# Patient Record
Sex: Male | Born: 1945 | Race: White | Hispanic: No | Marital: Married | Smoking: Former smoker
Health system: Southern US, Community
[De-identification: ages and names within clinical notes are randomized; demographics above are authoritative.]

## PROBLEM LIST (undated history)

## (undated) DIAGNOSIS — G629 Polyneuropathy, unspecified: Secondary | ICD-10-CM

## (undated) DIAGNOSIS — K219 Gastro-esophageal reflux disease without esophagitis: Secondary | ICD-10-CM

## (undated) DIAGNOSIS — G4730 Sleep apnea, unspecified: Secondary | ICD-10-CM

## (undated) DIAGNOSIS — I10 Essential (primary) hypertension: Secondary | ICD-10-CM

## (undated) DIAGNOSIS — E7800 Pure hypercholesterolemia, unspecified: Secondary | ICD-10-CM

## (undated) DIAGNOSIS — M1990 Unspecified osteoarthritis, unspecified site: Secondary | ICD-10-CM

## (undated) DIAGNOSIS — M549 Dorsalgia, unspecified: Secondary | ICD-10-CM

## (undated) HISTORY — DX: Sleep apnea, unspecified: G47.30

## (undated) HISTORY — PX: DENTAL SURGERY: SHX609

## (undated) HISTORY — PX: MIDDLE EAR SURGERY: SHX713

## (undated) HISTORY — PX: COLONOSCOPY: SHX174

---

## 2004-12-27 ENCOUNTER — Observation Stay (HOSPITAL_COMMUNITY): Admission: EM | Admit: 2004-12-27 | Discharge: 2004-12-28 | Payer: Self-pay | Admitting: Emergency Medicine

## 2004-12-28 ENCOUNTER — Encounter: Payer: Self-pay | Admitting: Cardiology

## 2004-12-28 ENCOUNTER — Ambulatory Visit: Payer: Self-pay | Admitting: Cardiology

## 2004-12-29 ENCOUNTER — Ambulatory Visit: Payer: Self-pay

## 2006-06-01 IMAGING — CR DG CHEST 2V
2 series · 2 of 2 positions shown · non-contrast
Comparison: none

CLINICAL DATA: Midchest pain.  Tingling.  Shortness of breath. 
 CHEST ? 2 VIEW:
 PA and lateral views of the chest dated 12/27/04 are reviewed without prior films for comparison.  The heart is normal in size.  The pulmonary vascularity is within normal limits and no active inflammatory change is noted.  There appears to be minimal scarring in the left cardiophrenic area.

[w chest pa]
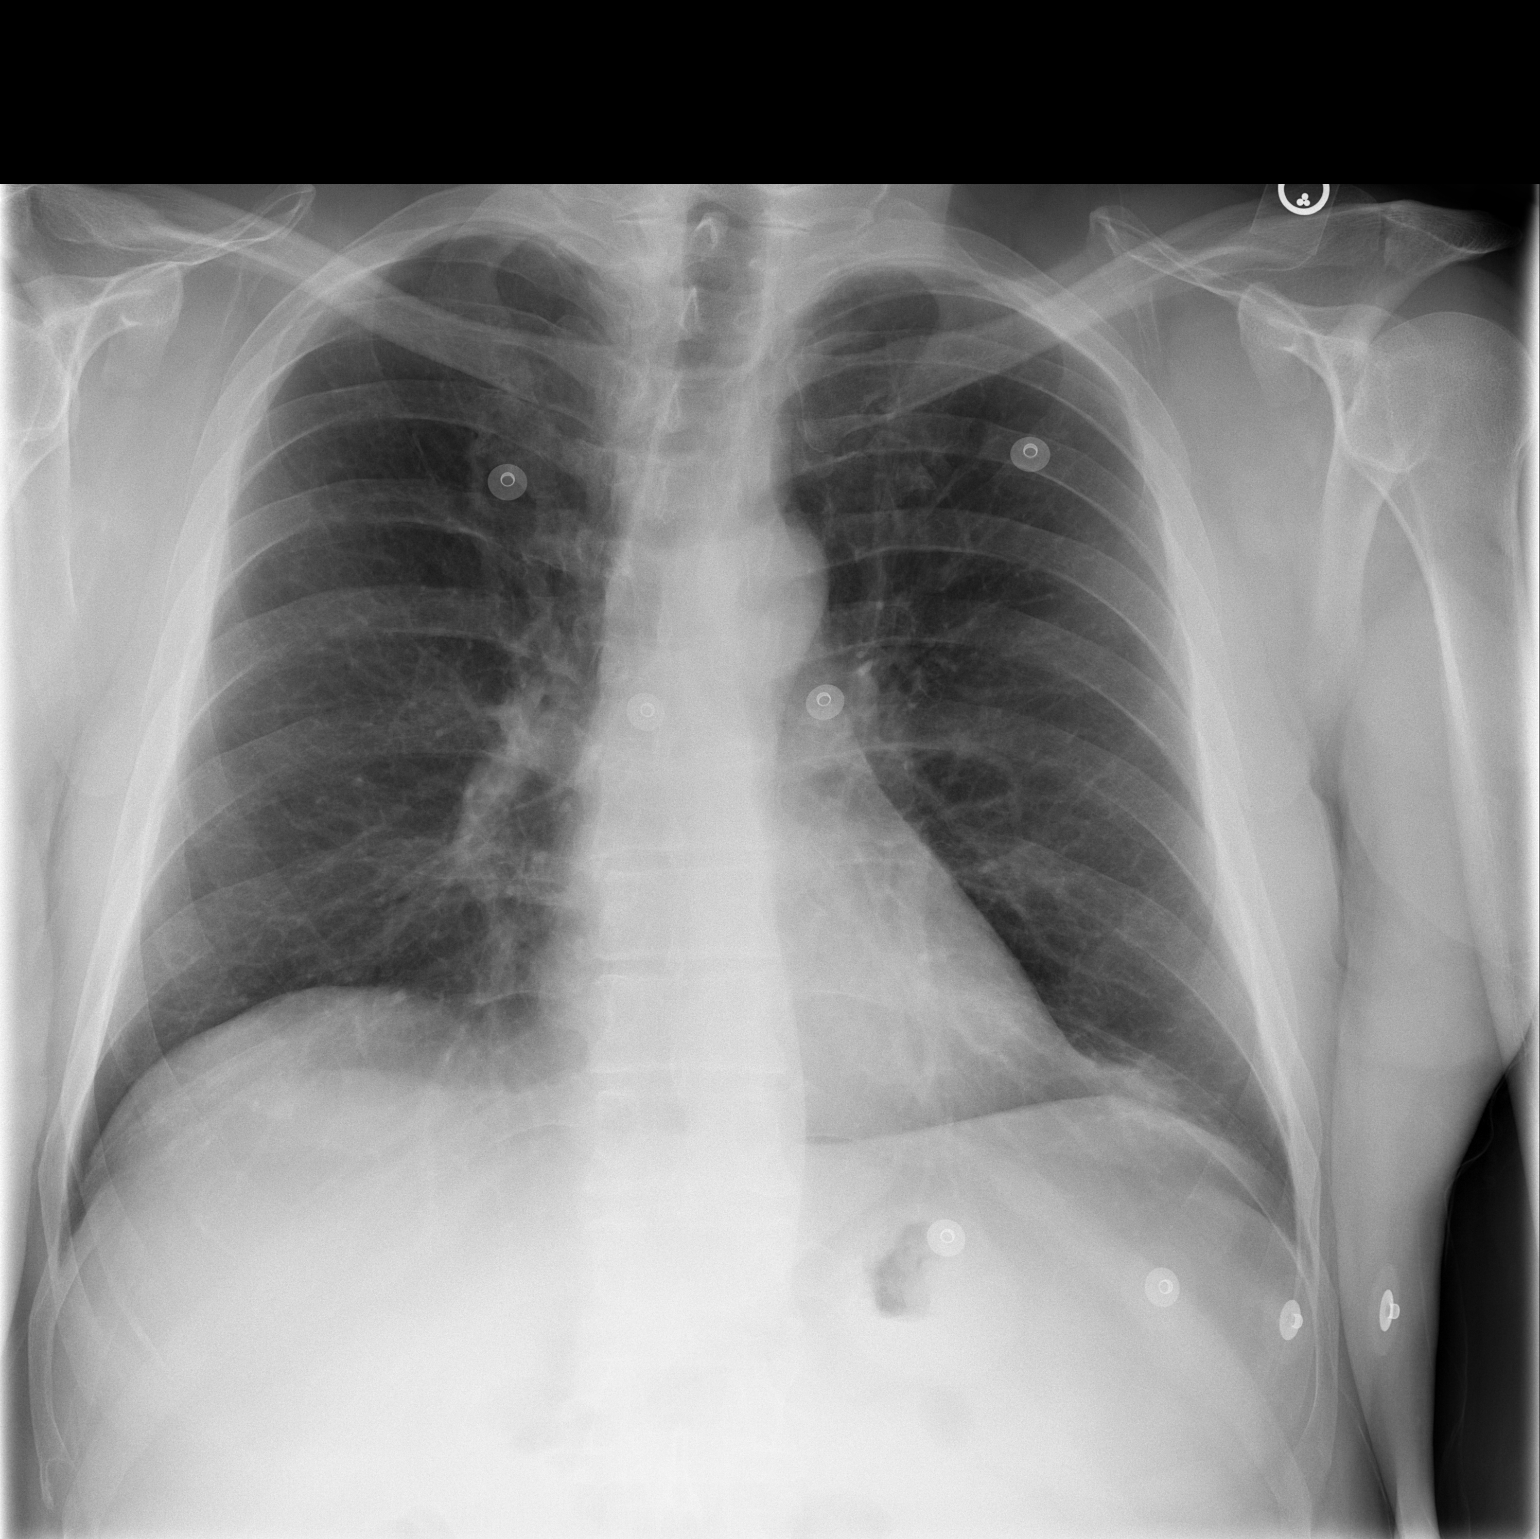

[w chest lat]
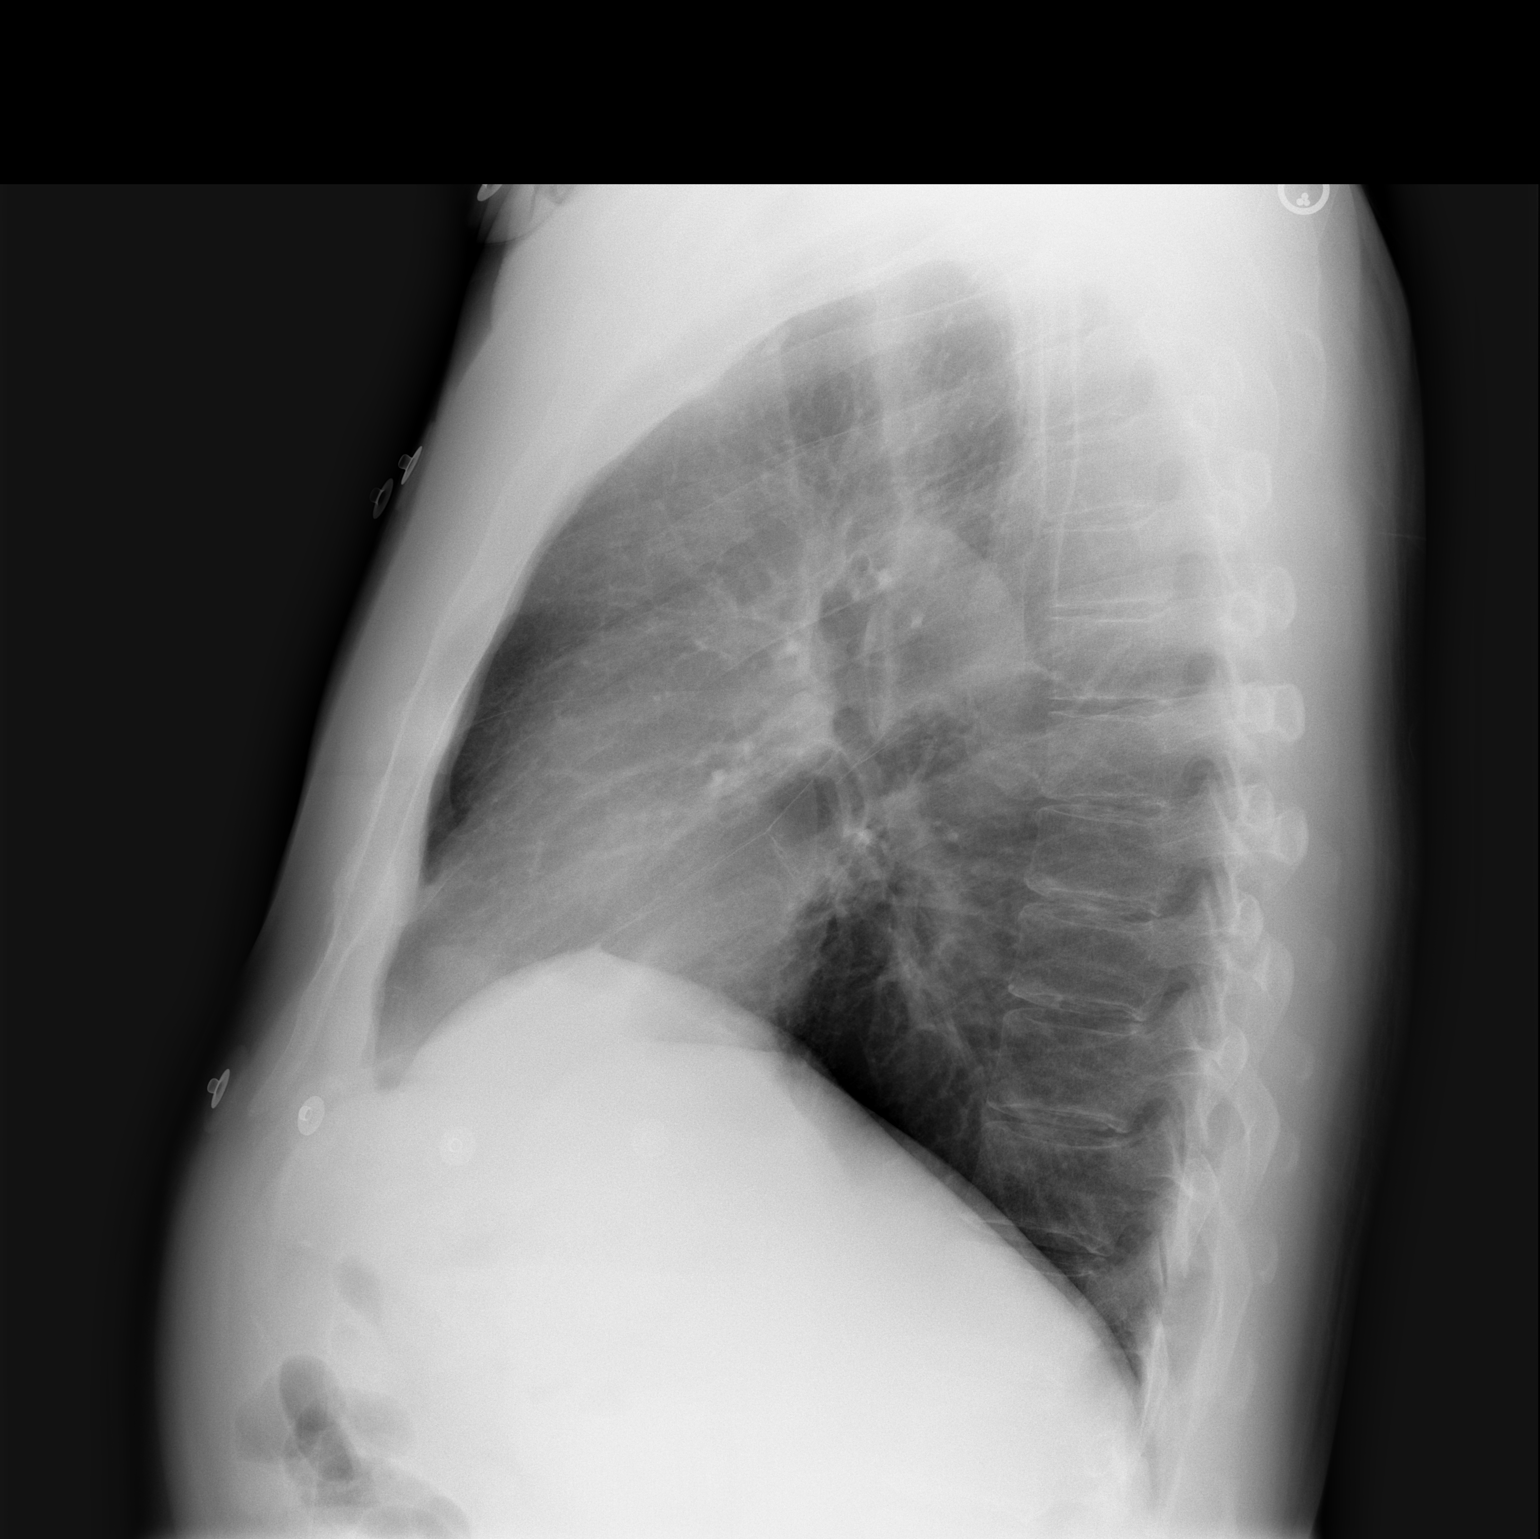

[2 of 2 positions shown; findings below may reference images not displayed]

IMPRESSION: Negative chest for active disease.

## 2006-07-25 HISTORY — PX: HAND SURGERY: SHX662

## 2006-09-05 ENCOUNTER — Ambulatory Visit (HOSPITAL_BASED_OUTPATIENT_CLINIC_OR_DEPARTMENT_OTHER): Admission: RE | Admit: 2006-09-05 | Discharge: 2006-09-05 | Payer: Self-pay | Admitting: Orthopedic Surgery

## 2007-05-22 ENCOUNTER — Ambulatory Visit (HOSPITAL_BASED_OUTPATIENT_CLINIC_OR_DEPARTMENT_OTHER): Admission: RE | Admit: 2007-05-22 | Discharge: 2007-05-22 | Payer: Self-pay | Admitting: Orthopedic Surgery

## 2007-07-26 HISTORY — PX: SHOULDER ARTHROSCOPY: SHX128

## 2010-12-07 NOTE — Op Note (Signed)
[name] [name], [name] [name]             ACCOUNT NO.:  [id number]   MEDICAL RECORD NO.:  [id number]          PATIENT TYPE:  AMB   LOCATION:  DSC                          FACILITY:  [facility]   PHYSICIAN:  Katy Fitch. Sypher, M.D. 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   OPERATION/PROCEDURE:  1. Examination of right shoulder under anesthesia documenting      significant adhesive capsulitis.  2. Arthroscopic debridement of labrum tenolysis of anterior capsular      ligaments and debridement of scar tissue from rotator interval.  3. Arthroscopic subacromial decompression with extensive tenolysis of      rotator cuff bursectomy and acromioplasty.  4. Arthroscopic distal clavicle resection.   SURGEON:  Katy Fitch. Sypher, M.D.   ASSISTANT:  Molly Maduro Dasnoit PA-C.   ANESTHESIA:  General by endotracheal technique supplemented by a right  interscalene block. Supervising anesthesiologist is Bedelia Person, M.D.   INDICATIONS:  [name] [name] is a 65 year old gentleman who I  met 1 year ago with a complex fracture dislocation of his left small  finger carpometacarpal joint.   [name] [name] had a 4+ swelling response following his fracture and ultimately  underwent attempted open reduction internal fixation of his  carpometacarpal joint.  His post injury course was marked by the  development of chronic swelling and a regional  pain syndrome which has  required about 1 year of sympathetic blocks,  extensive therapy and  rehabilitation.   During our period of working together, he reported bilateral shoulder  pain, right worse than left.  Plain x-ray of the shoulder demonstrated  AC arthropathy and signs of impingement.  An MRI of the shoulder  documented extensive tendinopathy of the rotator cuff and the  radiologist was concerned that there was a rotator cuff interval tear  and a superior subscapularis tear.   Due to failed respond to nonoperative measures, [name] [name] was advised  to present to the operating room at this time for examination of the  shoulder under anesthesia, anticipating arthroscopic debridement,  probable subacromial decompression, probable distal clavicle resection,  and possible rotator cuff repair.  Preoperatively questions were invited  and answered in detail, both in the office and in the preoperative  holding area.  Dr. Gypsy Balsam interviewed [name] [name] and after informed  consent, placed a right interscalene block for perioperative analgesia.  [name] [name] is brought to the operating room at this time.   DESCRIPTION OF PROCEDURE:  Seizure [name] [name] is brought to room #1  and placed in supine position on the operating table.   Under Dr. Burnett Corrente strict supervision, general endotracheal anesthesia  was induced.  Mr. Friberg was carefully positioned in the beach-chair  position with aid of a torso and head holder designed for shoulder  arthroscopy.   The entire right upper extremity and forequarter were prepped with  DuraPrep and draped with impervious arthroscopy drapes.  Examination of  the right shoulder under anesthesia revealed combined elevation 155  external rotation of 65 and a lack of extension of the scapular plane of  minus 30 degrees.  He obviously had a significant adhesive capsulitis.   The scope was then placed through the standard posterior viewing portal  with  blunt technique.  Diagnostic arthroscopy confirmed abundant  adhesive capsulitis, granulation tissues and extensive capsular  adhesions.  The anatomy of the anterior glenohumeral ligaments was  disrupted significantly by these findings.   A 4.5-mm suction shaver was brought in anteriorly and used to thoroughly  debride the granulation tissue followed by use of a bipolar cautery for  hemostasis.  The subscapularis tendon was thoroughly tenolysed both on  its deep and superficial surface and the anterior capsular ligaments  were cleaned of all scar and granulation tissue.  A blunt instrument was  used to separate the folds of the capsule and the anterior capsular  ligaments.   The rotator interval was shaved thoroughly, removing abundant scar.  The  long head of the biceps had a stable origin and was essentially intact  through the rotator interval.  The deep surface of the subscapularis had  some superior degenerative change and the deep surface of the  supraspinatus and infraspinatus were noted to be intact.  The inferior  recess was inspected.  No loose bodies noted.   The scope was then removed from the glenohumeral joint and placed in  subacromial space.  There was a florid bursitis with a abundant  adhesions between the rotator cuff and the coracoacromial ligament and  the undersurface of the acromion.   These were cleared with a suction shaver followed by hemostasis with  bipolar cautery.  The capsule of AC joint was prominent.  This was taken  down the cutting cautery and the distal 15 mm of clavicle removed  arthroscopically.  The acromion was leveled to a type 1 morphology and  the coracoacromial ligament was relaxed with cutting cautery.  Hemostasis achieved with bipolar cautery.   The rotator interval was found to be partially disrupted due to chronic  AC impingement and sub coracoacromial ligament and anterior acromial  impingement.  After decompression, this was debrided  to a stable margin.  No attempt was made to repair the rotator interval.   The subacromial space was carefully inspected and posterior adhesions  and fibrotic bursa resected with a suction shaver.   After hemostasis achieved, photographic documentation of the  decompression and debridement was accomplished followed by removal of  the arthroscopic equipment.   The portals repaired with mattress suture, repaired with intradermal  through Prolene and Steri-Strips.  A voluminous gauze dressing applied  with paper tape.   Mr. Marlin was transferred to the recovery room with stable vital  signs.  He will be discharged to the care of his family with  prescriptions for Dilaudid 2 mg 1 or 2 tablets p.o. q.4-6h. p.r.n. pain,  30 tablets without refill.  Also Motrin 6 mg one p.o. q.6h p.r.n. pain  30 tablets with one refill and Keflex 5 mg one p.o. q.8h for four days  as a prophylactic antibiotic.      Katy Fitch Sypher, M.D.  Electronically  Signed     RVS/MEDQ  D:  05/22/2007  T:  05/23/2007  Job:  [id number]   cc:   Alfredia Client, MD

## 2010-12-10 NOTE — Op Note (Signed)
NAMEANTONEY, BIVEN             ACCOUNT NO.:  [id number]   MEDICAL RECORD NO.:  [id number]          PATIENT TYPE:  AMB   LOCATION:  DSC                          FACILITY:  [facility]   PHYSICIAN:  Katy Fitch. Sypher, M.D. DATE OF BIRTH:  01/30/1946   DATE OF PROCEDURE:  09/05/2006  DATE OF DISCHARGE:                               OPERATIVE REPORT   PREOPERATIVE DIAGNOSIS:  Comminuted intra-articular fracture of left  small finger metacarpal at carpometacarpal articulation with explosion  of joint surface and dorsal subluxation of small metacarpal off of  hamate.   POSTOPERATIVE DIAGNOSIS:  Comminuted intra-articular fracture of left  small finger metacarpal at carpometacarpal articulation with explosion  of joint surface and dorsal subluxation of small metacarpal off of  hamate.   OPERATION:  1. Open reduction and internal fixation of left small finger      comminuted articular metacarpal fracture at carpometacarpal      articulation.  2. Arthrotomy of carpometacarpal joint with removal of multiple free      fragments of hyaline cartilage and comminuted bone fragments      followed by reduction of carpometacarpal joint and fixation of the      fracture dislocation utilizing two 0.045-inch Kirschner wires from      the small finger metacarpal to the ring finger metacarpal and a      single oblique 0.045 inch Kirschner wire through the proximal      metaphysis into the hamate to secure the dorsal subluxation.   SURGEON:  Katy Fitch. Sypher, M.D.   ASSISTANT:  Marveen Reeks. Dasnoit, P.A.-C.   ANESTHESIA:  General by LMA.   SUPERVISING ANESTHESIOLOGIST:  Quita Skye. Krista Blue M.D./David America Brown,  M.D.   INDICATIONS:  [name] [name] is a 65 year old gentleman  referred through the courtesy of Dr. Morrie [name] of [facility]  for evaluation and management of a severely comminuted reversed  Bennett's fracture involving the left small finger carpometacarpal  joint.  While  chasing a small dog at home, he fell in his neighbors  driveway sustaining a complicated laceration to the ring small webspace  and a reverse Bennett's fracture.  X-rays from [facility] suggested a fracture dislocation.  He was seen for consultation  on September 04, 2006, at [facility] where repeat x-rays  confirmed a comminuted explosion type fracture at the base of his small  finger metacarpal and dorsal subluxation of the metacarpal off of the  hamate.  We advised open reduction and internal fixation with joint  debridement at this time.  Preoperatively, he was advised that due to  the comminuted nature of his fracture, it could be challenging to obtain  and maintain anatomic reduction of the articular surface.  In general,  our goal will be to reduce the metacarpal back onto the hamate and to  debride the [facility] joint of free fragments and reassemble the articular  surface to the best of our abilities.  These fractures are notoriously  unstable and the degree of comminution can be quite challenging to  control with plate, screw, or K-wire fixation.  At times, a fusion of  the carpometacarpal joint is required at a later date should post  traumatic arthrosis intervene.  After informed consent, [name] is  brought to the operating room at this time.   PROCEDURE:  [name] [name] is brought to the operating room and  placed in the supine position on the operating table.  Following the  induction of general anesthesia by LMA technique, 1 gram of Ancef was  administered as an IV prophylactic antibiotic.  The left arm was prepped  with Betadine soap solution and sterilely draped.  A pneumatic  tourniquet was applied to the proximal left brachium.  Following  exsanguination of the left arm with an Esmarch bandage, the arterial  tourniquet on the proximal brachium was inflated to 220 mmHg.   The procedure commenced with a 4 cm exposure from the  diaphysis of the  small finger metacarpal to the mid hamate.  The subcutaneous tissues  were carefully divided taking care to identify the dorsal ulnar sensory  branch.  There was nearly 2 cm of swelling in the subcutaneous space due  to massive swelling following the injury.  The articulation was  identified with the aid of a 27 gauge needle and a C-arm fluoroscope.  Given the massive swelling of [name] hand, C-arm images were  challenging to interpret due to challenges obtaining enough KV and MA to  obtain proper imaging.  Ultimately, the articulation was inspected under  direct vision.  The base of the small finger metacarpal was profoundly  comminuted.   After the capsule was entered, free fragments were removed from the  joint.  The main articular fragments were assembled as anatomic as  possible followed by reduction of the metacarpal shaft.  The metacarpal  shaft was secured by two picket fence 0.045-inch Kirschner wires driven  through the base of the proximal metaphysis of the small finger  metacarpal into the ring finger metacarpal, taking care to adjust for  proper rotational alignment.  A third Kirschner wires then used through  the shaft of the proximal metaphysis into the hamate preventing further  dorsal subluxation.  The capsule of the [facility] joint was then reassembled  as anatomic as possible.   A series of oblique C-arm images were attempted in an effort to be  certain that there were no more extruded fragments of joint surface.  We  were challenged to fully visualize the articular surface given the  swelling of Mr. [name] hand.  Within the technical limitations of our C-  arm fluoroscope, I could not identify any more extruded fragments. Also,  by palpation and direct inspection, there did not appear to be any  further articular fragments.  The joint was lavaged and a microcuret was  used to remove cancellous bone and free hyaline cartilage from the  joint.  The  wound was then repaired with subdermal sutures of 4-0 Vicryl and  intradermal 3-0 Prolene.  Steri-Strips were applied followed by a  voluminous hand dressing with plaster splints maintaining the fingers in  a safe position.  There were no apparent complications.   For aftercare, Mr. Hoard is provided prescriptions for Percocet 5 mg,  1-2 tablets p.o. q.4-6h. p.r.n. pain, 30 tablets without refill.  Also,  Motrin 600 mg, 1 p.o. q.6h. p.r.n. pain, 30 tablets with one refill and  Keflex 500 mg 1 p.o. q.8h. x4 days as a prophylactic antibiotic.      Katy Fitch Sypher, M.D.  Electronically Signed  RVS/MEDQ  D:  09/05/2006  T:  09/05/2006  Job:  [id number]   cc:   Franklyn Lor, MD

## 2010-12-10 NOTE — H&P (Signed)
[name], [name]             ACCOUNT NO.:  [id number]   MEDICAL RECORD NO.:  [id number]          PATIENT TYPE:  EMS   LOCATION:  MAJO                         FACILITY:  [facility]   PHYSICIAN:  Mallory Shirk, MD     DATE OF BIRTH:  02-19-1946   DATE OF ADMISSION:  12/27/2004  DATE OF DISCHARGE:                                HISTORY & PHYSICAL   CHIEF COMPLAINT:  Chest pain for several weeks.   HISTORY OF PRESENT ILLNESS:  [name] [name] is a very pleasant 64 year old  Caucasian gentleman who has had episodes of chest pain lasting up to 5  minutes for over a month now.  The pain has been episodic, nonradiating, and  initially started 2 to 3 times a week but lately has been coming on daily.  It resolves without intervention.  He has positive shortness of breath and  diaphoresis during the 5 minutes that he has the pain.  No nausea or  vomiting.  No precipitating factors.  No relieving factors when he has the  pain.  No other complaints.  The patient states that he works at [employer], and  he thinks that when he is stressed he gets the pain.  Otherwise, he has no  precipitating factors.   PAST MEDICAL HISTORY:  Significant for:  1.  Hypertension.  2.  Hyperlipidemia, recently diagnosed (the patient is followed at the [facility] in [place]).   ALLERGIES:  NKDA.   MEDICATIONS:  On admission:  1.  Cyclobenzaprine 10 mg p.o. daily.  2.  Loratadine 10 mg p.o. daily.   SOCIAL HISTORY:  The patient works for [employer].  He smokes one pack of  cigarettes per day for 30 to 40 years.  No alcohol or illicit drug use.   FAMILY HISTORY:  Significant for mother dying of cirrhosis at age 42,  nonalcoholic.  Mother also had rheumatic fever.  Brother died of cirrhosis  in his 30s of unclear etiology.  Two brothers and one sister in good health.   REVIEW OF SYSTEMS:  Greater than 10 systems reviewed; other than HPI,  negative.   PHYSICAL EXAMINATION:  GENERAL:  Very pleasant  elderly Caucasian gentleman  lying in bed in no acute distress, alert and oriented x 3, and cooperative  with the exam.  VITAL SIGNS:  On admission, blood pressure 119/73.  Pulse 93.  Temperature  97.3.  Respirations 20.  HEENT:  Normocephalic and atraumatic.  Pupils equal, round, and reactive to  light.  Sclerae anicteric.  Mucous membranes are moist.  NECK:  Supple.  No __________.  No JVD.  LUNGS:  Clear to auscultation bilaterally.  No wheezes.  No rales.  CARDIOVASCULAR:  S1, S2.  Regular rate and rhythm.  No murmurs, rubs, or  gallops.  Pain elicited on palpation of the left sternal border.  This is  the same pain that the patient feels coming on as a sharp pain.  ABDOMEN:  Soft.  Positive bowel sounds.  No tenderness.  No masses.  EXTREMITIES:  No cyanosis, clubbing, or edema.  NEUROLOGIC:  Cranial  nerves 2-12 grossly intact.  Sensory and motor within  normal limits.  No focal deficits seen.   LABORATORY DATA:  Chest x-ray shows no active disease.  WBC 8.4, hemoglobin  15.1, hematocrit 44.3, MCV 89.8, platelets 275.  Point-of-care cardiac  enzymes negative.  Sodium 143, potassium 4.1, chloride 109, carbon dioxide  25, glucose 90, BUN 11, creatinine 1.1, calcium 8.9, albumin 3.7, AST 18,  ALT 21, alkaline phosphatase 75, total bilirubin 0.5.   ASSESSMENT AND PLAN:  A 65 year old Caucasian gentleman with complaints of  chest pain, episodic, lasting up to 5 minutes, resolving without  intervention, for several weeks but more frequent in the last few weeks.  1.  Chest pain, rule out acute coronary syndrome.  In light of the patient's      multiple risk factors, including smoking, hypertension, and      hyperlipidemia, we will rule out the patient for acute coronary syndrome      with cardiac enzymes, 3 sets.  We will also place him on nasal cannula      and give him some aspirin, nitroglycerin, ace inhibitor, and low-dose      metoprolol.  Will also start him on simvastatin 20 mg  p.o. daily.  Will      check TSH, fasting lipid profile, and 2-D echo.  The patient will need      risk stratification in light of his number of multiple risk factors for      coronary artery disease.  We will set up the patient with cardiology      follow up for risk stratification.  2.  Hyperlipidemia.  He has been started on Zocor 20 mg p.o. daily.  We will      check a lipid profile in the morning.  3.  Hypertension.  The patient's blood pressure on admission was initially      141/87 and on repeat was 119/73.  We have him on low-dose beta blocker      and ace inhibitor.  Will also give him nitroglycerin paste.  Will      continue to monitor his blood pressure.  4.  Prophylaxis.  The patient has on sequential compression devices for deep      venous thrombosis prophylaxis and Protonix for gastrointestinal      prophylaxis.   DISPOSITION:  After ruling out acute coronary syndrome, the patient will be  discharged home with primary care follow up.  The patient does not have a  primary in the area; he will be set up with one.      GDK/MEDQ  D:  12/27/2004  T:  12/27/2004  Job:  [id number]

## 2010-12-10 NOTE — Consult Note (Signed)
NAME:  COLLIS, THEDE NO.:  [id number]   MEDICAL RECORD NO.:  [id number]          PATIENT TYPE:  INP   LOCATION:  4740                         FACILITY:  [facility]   PHYSICIAN:  Jonelle Sidle, M.D. LHCDATE OF BIRTH:  06-25-1946   DATE OF CONSULTATION:  12/28/2004  DATE OF DISCHARGE:                                   CONSULTATION   REFERRED BY:  Mallory Shirk, MD   REASON FOR CONSULTATION:  Mr. Mccleod is a 65 year old male, with no prior  cardiac history, but multiple cardiac risk factors notable for long standing  tobacco smoking, hyperlipidemia, borderline hypertension, age, and obesity.   The patient presented to the emergency room yesterday with complaint of  recurrent, intermittent, chest discomfort. He states that this has increased  in frequency over the past several months, but reports that he has been  having chest pain for at least one year. He describes his symptoms as sharp  and dull overlying the precordium, and not associated with any radiation to  the upper extremities or neck. He also reports no associated diaphoresis,  dyspnea, or nausea. The patient reports that his symptoms typically occur  with activity; however, he denies any precipitation of chest discomfort with  either walking up a flight of stairs or up a steep hill. His wife points out  that he is under considerable stress and the patient reports that these  episodes typically occur at work.   Although he points out that he does have occasional heartburn, he states  that symptoms are dissimilar. He also notes a fleeting nature to the  episodes, stating that they last only a few seconds or at most one minute in  duration.   Since admission, serial cardia markers have all been within normal limits.  Admission electrocardiogram revealed no ischemic changes.   ALLERGIES:  No known drug allergies.   MEDICATIONS PRIOR TO ADMISSION:  1.  Flexeril 10 mg q.h.s.  2.  Coated aspirin 325  mg q.d.  3.  Loratadine 10 mg q.d.  4.  Potassium chloride (OTC) 1-2 tablets a day.   PAST MEDICAL HISTORY:  Hyperlipidemia, longstanding tobacco smoking,  question sleep apnea. No major surgeries.   SOCIAL HISTORY:  The patient lives in [place] with his wife. He works  at [employer] on [employer] in [place]. They have one grown daughter  and a grandchild. He has been smoking at least half a pack a day for the  past 40 years or so. Denies alcohol use.   FAMILY HISTORY:  Mother deceased at age 2, complications from liver  cirrhosis. Father deceased in his 30's, complications from pneumonia. There  is family history of coronary artery disease.   REVIEW OF SYMPTOMS:  Chronic __________ dyspnea, no recent exacerbation and  not associated with orthopnea, paroxysmal nocturnal dyspnea, or pedal edema.  Has occasional palpitations which are also associated with these episodes of  chest pain. Has occasional chest pain. No recent evidence of upper or lower  GI bleeding. Remaining systems are negative.   PHYSICAL EXAMINATION:  VITAL SIGNS:  Blood pressure 91/57, pulse 57,  respirations 16, temperature 97.2. SAO2 94% on room air, weight 220.8.  GENERAL:  A 65 year old, obese, male in no apparent distress.  HEENT:  Normocephalic, atraumatic.  NECK:  Posterior bilateral carotid pulse but no bruits.  LUNGS:  Clear to auscultation in all fields.  HEART:  Regular rate and rhythm (S1, S2), no murmurs, rubs or gallops.  ABDOMEN:  Protuberant, nontender. Intact bowel sounds without bruits.  EXTREMITIES:  Preserved bilateral femoral pulse __________; intact distal  pulses with no edema.  NEUROLOGIC:  No focal deficit.   Admission chest x-ray:  No acute disease. Electrocardiogram:  Normal sinus  rhythm at 83 BPM with normal axis; nonspecific ST changes.   LABORATORY DATA:  Hemoglobin 13.8, hematocrit 40.7, WBC 8.2, platelets 277.  Sodium 143, potassium 3.7, BUN 12, creatinine 1.2,  glucose 92, liver enzymes  normal. TSH and fasting lipid profile pending. Cardiac enzymes:  Normal CPK-  MB and troponin I markers (x3).   IMPRESSION:  1.  Atypical chest pain.      1.  Normal cardiac marker/serial electrocardiogram.  2.  Palpitations.  3.  Multiple cardiac risk factors.      1.  Longstanding tobacco.      2.  Hyperlipidemia.      3.  Borderline hypertension.      4.  Age.  4.  Exertional dyspnea.  5.  Obesity.   PLAN:  The patient presents with atypical chest discomfort, in the setting  of several cardiac risk factors, but with all serial cardiac markers and  serial EKG's within normal limits. A 2-D echocardiogram has been done, and  if this reveals left ventricular function and no focal wall motion  abnormalities, then the patient is cleared for discharge with plans to  proceed with outpatient stress testing. Arrangements have already been made  for an exercise stress Myoview at [facility] tomorrow, June 7, at  11:45 a.m.   Regarding medications, recommendations to continue treatment with aspirin,  Toprol XL 25 q.d. and consider discharging home on a Statin. The patient has  a recent LDL of 145; however, he has not yet initiated lifestyle  changes with modification of diet and initiation of regular exercise. The  patient is also strongly advised to stop smoking tobacco. Additionally,  recommendation is for patient to establish with a local primary care  physician. He has expressed interest in seeking one in [place] where  they live.      GS/MEDQ  D:  12/28/2004  T:  12/28/2004  Job:  [id number]

## 2010-12-10 NOTE — Discharge Summary (Signed)
[name] [name], ISEMAN             ACCOUNT NO.:  [id number]   MEDICAL RECORD NO.:  [id number]          PATIENT TYPE:  INP   LOCATION:  4740                         FACILITY:  [facility]   PHYSICIAN:  Mobolaji B. Bakare, M.D.DATE OF BIRTH:  1946-01-08   DATE OF ADMISSION:  12/27/2004  DATE OF DISCHARGE:  12/28/2004                                 DISCHARGE SUMMARY   FINAL DIAGNOSIS:  1.  Chest pain ruled out for myocardial infarction.  2.  Possible anxiety.  3.  Obesity.  4.  Hyperlipidemia.  5.  Hypertension.   PROCEDURE:  2D echocardiogram which showed normal left ventricular function  with ejection fraction of 55-60%.  There is no significant lesion.  This  study was inadequate for regional wall motion abnormality.   CHIEF COMPLAINT:  Chest pain and palpitations.   BRIEF HISTORY:  Please refer to admission H&P.  Mr. Tamari Busic is a  pleasant 65 year old Caucasian male with cardiovascular risk factor of  hypertension, hyperlipidemia, and smoking, with obesity.  He presents with  palpitations which have been off and on for a couple of months.  This  recently became associated with chest pain and on the day of admission, he  had chest pain without palpitations.  Hence, he was admitted for chest pain  to rule out MI.  He did rule out with two negative sets of cardiac panels  and also ruled out with three negative sets of cardiac enzymes at the point  of care.  Chest x-ray was negative for any active disease.   HOSPITAL COURSE:  He was admitted to telemetry and there was no abnormal  arrhythmias, sinus bradycardia which is sleeping bradycardia.  He was  initially treated as acute coronary syndrome protocol.  He was placed on  Nitropaste, started on Lopressor, there was much less pain during the  hospitalization.  He was evaluated by Dr. Diona Browner, cardiologist.  He had a  2D echocardiogram which was not sufficient for any regional wall motion  abnormality.  He was seen for smoking  cessation counseling and the patient  is motivated to stop smoking.  He was started on nicotine patch which would  continue as an outpatient.  He was also advised on weight loss and advised  on exercise and watching his diet.  The patient was seen along with his  wife.   DISPOSITION:  He will follow up with Dr. Diona Browner in the office for an  exercise stress test tomorrow, December 29, 2004.  He will follow up with his  doctor at the [facility].  Diet includes low cholesterol diet and low  sodium diet.   DISCHARGE MEDICATIONS:  1.  Aspirin 325 mg p.o. daily.  2.  Atenolol 25 mg p.o. daily.  3.  Nicotine patch 25 mg once daily.  4.  He was instructed to resume other home medications which include      Flexeril.       MBB/MEDQ  D:  12/28/2004  T:  12/28/2004  Job:  [id number]   cc:   Jonelle Sidle, M.D. [facility] in [place]

## 2011-05-04 LAB — BASIC METABOLIC PANEL
Calcium: 9.4
GFR calc non Af Amer: 60 — ABNORMAL LOW
Sodium: 140

## 2011-05-04 LAB — POCT HEMOGLOBIN-HEMACUE: Operator id: 123881

## 2015-06-19 ENCOUNTER — Emergency Department (HOSPITAL_COMMUNITY): Payer: Medicare HMO

## 2015-06-19 ENCOUNTER — Emergency Department (HOSPITAL_COMMUNITY)
Admission: EM | Admit: 2015-06-19 | Discharge: 2015-06-19 | Disposition: A | Discharge status: Home / Self Care | Payer: Medicare HMO | Attending: Emergency Medicine | Admitting: Emergency Medicine

## 2015-06-19 ENCOUNTER — Encounter (HOSPITAL_COMMUNITY): Payer: Self-pay | Admitting: Vascular Surgery

## 2015-06-19 DIAGNOSIS — Z87891 Personal history of nicotine dependence: Secondary | ICD-10-CM | POA: Insufficient documentation

## 2015-06-19 DIAGNOSIS — E7800 Pure hypercholesterolemia, unspecified: Secondary | ICD-10-CM | POA: Diagnosis not present

## 2015-06-19 DIAGNOSIS — I491 Atrial premature depolarization: Secondary | ICD-10-CM | POA: Diagnosis not present

## 2015-06-19 DIAGNOSIS — R0602 Shortness of breath: Secondary | ICD-10-CM | POA: Diagnosis not present

## 2015-06-19 DIAGNOSIS — K219 Gastro-esophageal reflux disease without esophagitis: Secondary | ICD-10-CM | POA: Insufficient documentation

## 2015-06-19 DIAGNOSIS — Z79899 Other long term (current) drug therapy: Secondary | ICD-10-CM | POA: Insufficient documentation

## 2015-06-19 DIAGNOSIS — M1990 Unspecified osteoarthritis, unspecified site: Secondary | ICD-10-CM | POA: Diagnosis not present

## 2015-06-19 DIAGNOSIS — Z9889 Other specified postprocedural states: Secondary | ICD-10-CM | POA: Diagnosis not present

## 2015-06-19 DIAGNOSIS — R079 Chest pain, unspecified: Secondary | ICD-10-CM | POA: Diagnosis not present

## 2015-06-19 DIAGNOSIS — R319 Hematuria, unspecified: Secondary | ICD-10-CM | POA: Diagnosis not present

## 2015-06-19 DIAGNOSIS — I10 Essential (primary) hypertension: Secondary | ICD-10-CM | POA: Diagnosis not present

## 2015-06-19 DIAGNOSIS — G629 Polyneuropathy, unspecified: Secondary | ICD-10-CM | POA: Insufficient documentation

## 2015-06-19 DIAGNOSIS — R509 Fever, unspecified: Secondary | ICD-10-CM | POA: Diagnosis not present

## 2015-06-19 DIAGNOSIS — Z7982 Long term (current) use of aspirin: Secondary | ICD-10-CM | POA: Insufficient documentation

## 2015-06-19 DIAGNOSIS — I499 Cardiac arrhythmia, unspecified: Secondary | ICD-10-CM | POA: Diagnosis present

## 2015-06-19 HISTORY — DX: Unspecified osteoarthritis, unspecified site: M19.90

## 2015-06-19 HISTORY — DX: Pure hypercholesterolemia, unspecified: E78.00

## 2015-06-19 HISTORY — DX: Gastro-esophageal reflux disease without esophagitis: K21.9

## 2015-06-19 HISTORY — DX: Essential (primary) hypertension: I10

## 2015-06-19 HISTORY — DX: Dorsalgia, unspecified: M54.9

## 2015-06-19 HISTORY — DX: Polyneuropathy, unspecified: G62.9

## 2015-06-19 LAB — I-STAT TROPONIN, ED: Troponin i, poc: 0 ng/mL (ref 0.00–0.08)

## 2015-06-19 LAB — BASIC METABOLIC PANEL
Anion gap: 8 (ref 5–15)
BUN: 11 mg/dL (ref 6–20)
CALCIUM: 8.6 mg/dL — AB (ref 8.9–10.3)
CHLORIDE: 101 mmol/L (ref 101–111)
CO2: 25 mmol/L (ref 22–32)
CREATININE: 1.38 mg/dL — AB (ref 0.61–1.24)
GFR, EST AFRICAN AMERICAN: 58 mL/min — AB (ref >60)
GFR, EST NON AFRICAN AMERICAN: 50 mL/min — AB (ref >60)
Glucose, Bld: 99 mg/dL (ref 65–99)
Potassium: 3.6 mmol/L (ref 3.5–5.1)
SODIUM: 134 mmol/L — AB (ref 135–145)

## 2015-06-19 LAB — CBC
HEMATOCRIT: 44.5 % (ref 39.0–52.0)
Hemoglobin: 14.6 g/dL (ref 13.0–17.0)
MCH: 30.8 pg (ref 26.0–34.0)
MCHC: 32.8 g/dL (ref 30.0–36.0)
MCV: 93.9 fL (ref 78.0–100.0)
PLATELETS: 227 10*3/uL (ref 150–400)
RBC: 4.74 MIL/uL (ref 4.22–5.81)
RDW: 11.9 % (ref 11.5–15.5)
WBC: 11.1 10*3/uL — ABNORMAL HIGH (ref 4.0–10.5)

## 2015-06-19 MED ORDER — POTASSIUM CHLORIDE CRYS ER 20 MEQ PO TBCR
40.0000 meq | EXTENDED_RELEASE_TABLET | Freq: Once | ORAL | Status: AC
  Administered 2015-06-19: 40 meq via ORAL
  Filled 2015-06-19: qty 2

## 2015-06-19 NOTE — ED Provider Notes (Signed)
CSN: [id number]     Arrival date & time 06/19/15  1526 History   First MD Initiated Contact with Patient 06/19/15 1909     Chief Complaint  Patient presents with  . Irregular Heart Beat     (Consider location/radiation/quality/duration/timing/severity/associated sxs/prior Treatment) HPI The patient had gone to see his outpatient provider for blood in his urine. He reports he is also experiencing cough for several days and achiness. His wife reports a low-grade fever in the range of 99. She reports after having given cold medications he did sweat fairly profusely. He also reports he's been getting short of breath with exertion and intermittently getting chest discomfort. He reports he doesn't tell a 1 about upper sometimes he just has to put his hand to his chest when he is having some chest pain and then it passes. Today while in the office, he was noted to have an irregular heartbeat and referred to the emergency department. Patient denies he's had any syncopal episodes or near syncope. Past Medical History  Diagnosis Date  . Hypercholesteremia   . Hypertension   . Neuropathy ([place])   . GERD (gastroesophageal reflux disease)   . Arthritis   . Back pain    Past Surgical History  Procedure Laterality Date  . Hand surgery    . Dental surgery     No family history on file. Social History  Substance Use Topics  . Smoking status: Former Smoker    Types: Cigarettes  . Smokeless tobacco: Never Used  . Alcohol Use: No    Review of Systems  10 Systems reviewed and are negative for acute change except as noted in the HPI.   Allergies  Review of patient's allergies indicates no known allergies.  Home Medications   Prior to Admission medications   Medication Sig Start Date End Date Taking? Authorizing Provider  aspirin EC 81 MG tablet Take 81 mg by mouth at bedtime.   Yes Historical Provider, MD  atenolol (TENORMIN) 25 MG tablet Take 25 mg by mouth 2 (two) times daily.   Yes  Historical Provider, MD  cholecalciferol (VITAMIN D) 1000 UNITS tablet Take 3,000 Units by mouth daily.   Yes Historical Provider, MD  gabapentin (NEURONTIN) 600 MG tablet Take 600 mg by mouth daily as needed (neuropathy).   Yes Historical Provider, MD  methocarbamol (ROBAXIN) 750 MG tablet Take 750 mg by mouth at bedtime.   Yes Historical Provider, MD  omeprazole (PRILOSEC) 20 MG capsule Take 20 mg by mouth at bedtime.   Yes Historical Provider, MD  rOPINIRole (REQUIP) 0.5 MG tablet Take 0.5 mg by mouth at bedtime.   Yes Historical Provider, MD  simvastatin (ZOCOR) 20 MG tablet Take 20 mg by mouth daily at 6 PM.   Yes Historical Provider, MD  sulfamethoxazole-trimethoprim (BACTRIM DS,SEPTRA DS) 800-160 MG tablet Take 1 tablet by mouth 2 (two) times daily. 06/19/15  Yes Historical Provider, MD  traMADol (ULTRAM) 50 MG tablet Take 50 mg by mouth every 6 (six) hours as needed.   Yes Historical Provider, MD   BP 149/74 mmHg  Pulse 57  Temp(Src) 98.1 F (36.7 C) (Oral)  Resp 28  SpO2 98% Physical Exam  Constitutional: He is oriented to person, place, and time. He appears well-developed and well-nourished.  HENT:  Head: Normocephalic and atraumatic.  Eyes: EOM are normal. Pupils are equal, round, and reactive to light.  Neck: Neck supple.  Cardiovascular: Normal rate, normal heart sounds and intact distal pulses.   Frequent ectopic  beats.  Pulmonary/Chest: Effort normal and breath sounds normal.  Abdominal: Soft. Bowel sounds are normal. He exhibits no distension. There is no tenderness.  Musculoskeletal: Normal range of motion. He exhibits no edema.  Neurological: He is alert and oriented to person, place, and time. He has normal strength. Coordination normal. GCS eye subscore is 4. GCS verbal subscore is 5. GCS motor subscore is 6.  Skin: Skin is warm, dry and intact.  Psychiatric: He has a normal mood and affect.    ED Course  Procedures (including critical care time) Labs  Review Labs Reviewed  BASIC METABOLIC PANEL - Abnormal; Notable for the following:    Sodium 134 (*)    Creatinine, Ser 1.38 (*)    Calcium 8.6 (*)    GFR calc non Af Amer 50 (*)    GFR calc Af Amer 58 (*)    All other components within normal limits  CBC - Abnormal; Notable for the following:    WBC 11.1 (*)    All other components within normal limits  I-STAT TROPOININ, ED    Imaging Review Dg Chest 2 View  06/19/2015  CLINICAL DATA:  Shortness of breath and cough for 1 month. History of an abnormal ECG. EXAM: CHEST  2 VIEW COMPARISON:  12/27/2004 FINDINGS: Elevation of the right hemidiaphragm with evidence of right basilar atelectasis. Upper lungs are clear. Heart size is upper limits of normal. No pleural effusions. Mild degenerative endplate changes in the thoracic spine. IMPRESSION: Volume loss at the right lung base with elevation of the right hemidiaphragm. Electronically Signed   By: Markus Daft M.D.   On: 06/19/2015 16:59   I have personally reviewed and evaluated these images and lab results as part of my medical decision-making.   EKG Interpretation   Date/Time:  Friday June 19 2015 19:34:13 EST Ventricular Rate:  89 PR Interval:  158 QRS Duration: 87 QT Interval:  412 QTC Calculation: 501 R Axis:   70 Text Interpretation:  Age not entered, assumed to be  69 years old for  purpose of ECG interpretation Sinus rhythm Multiform ventricular premature  complexes agree Confirmed by Johnney Killian, MD, Jeannie Done [phone]) on 06/19/2015  7:57:42 PM     Cardiology consult: Dr. Rosanna Randy reviewed EKGs. Frequent PACs with variable conduction.  Replace potassium, no other treatment at this time. MDM   Final diagnoses:  PAC (premature atrial contraction)  Hematuria   Patient initially went to see his family physician for hematuria. He was initiated on Bactrim. At this time his urine is not rechecked in the emergency department as this is being managed by his primary care provider.  Visual inspection of the urine shows it to be amber colored without any clots or obvious gross hematuria. Patient was referred to the emergency department for EKG abnormality. This is been evaluated and consistent with PACs and non-conducted PACs. These ECGs were reviewed with cardiology. No further recommendations other than to supplement potassium to get potassium to mid range rather than low normal. Patient is advised to follow-up with his family provider for recheck of potassium and ongoing monitoring is needed.    Charlesetta Shanks, MD 06/19/15 2027

## 2015-06-19 NOTE — ED Notes (Signed)
Patient reports going to UC to follow up on cough x 1 month, non productive and strong in nature, and recent diagnosed UTI. Patient reports UC then noted that his heart beat was irregular. Patient states he has had intermttent episodes of CP for approx 12 years, mild in nature and do not last long. Patient also reports sob with exertion that has progressed, states today when he walked to the car he barely made it. No swelling noted to lower extremities but states he has noted it at night.

## 2015-06-19 NOTE — Discharge Instructions (Signed)
Premature Atrial Contraction Take 20 mEq of potassium daily for the next 3-4 days and recheck with your family doctor. Premature atrial contractions (PACs) happen when your heart beats before it has had time to fill with blood. Your heart then has to pause until it can fill with blood for the next beat. This causes the next beat to be more forceful. PACs are also called skipped heartbeats because it may feel like your heart stops for a second.  Your heart has four chambers. There are two upper chambers (atria) and two lower chambers (ventricles). All the chambers need to work together to pump blood properly. Electrical signals spread across your heart and make all the chambers beat together.The signal to beat starts in your atria. If the atria fire a bit early, you may have a PAC.  CAUSES  The cause of a PAC is often unknown. PACs are sometimes caused by heart disease or injury. RISK FACTORS PACs are more common in children and older people. Other risk factors that may trigger PACs include:  Caffeine.  Stress.  Fatigue.  Alcohol.  Smoking.  Stimulant drugs. These may be prescription or illegal drugs.  Heart disease. SIGNS AND SYMPTOMS PACs are very common, especially in children and people 50 years and older. PACs do not cause dizziness, shortness of breath, or chest pain. The only symptom of a PAC is the sensation of a skipped or fluttering heartbeat.  DIAGNOSIS  Your health care provider can diagnose PAC based on the description of your symptom. Your health care provider may also:  Perform a physical exam to listen to your heart. Your heart may sound normal during this exam.  Perform tests to rule out other conditions. These tests may include an electrical tracing of your heart called electrocardiogram (ECG). You may need to wear a portable ECG machine (Holter monitor) that records your heart for 24 hours or more. TREATMENT  In most cases, PACs do not need to be treated. If you have  frequent PACs that are caused by heart disease, you may be treated for the underlying condition. HOME CARE INSTRUCTIONS  Do not use any tobacco products, including cigarettes, chewing tobacco, or electronic cigarettes. If you need help quitting, ask your health care provider.  Limit alcohol intake to no more than 1 drink per day for nonpregnant women and 2 drinks per day for men. One drink equals 12 ounces of beer, 5 ounces of wine, or 1 ounces of hard liquor.  Limit the amount of caffeine you take in.  Do not use illegal drugs.  Get at least 8 hours of sleep every night.  Find healthy ways to manage stress.  Get regular exercise. Ask your health care provider to suggest some activities that are safe for you. SEEK MEDICAL CARE IF:  You feel your heart skipping beats often (more than once a day).  Your heart skips beats and you feel dizzy, lightheaded, or very tired. SEEK IMMEDIATE MEDICAL CARE IF:   You have chest pain.  You have trouble breathing.   This information is not intended to replace advice given to you by your health care provider. Make sure you discuss any questions you have with your health care provider.   Document Released: 03/14/2014 Document Revised: 11/25/2014 Document Reviewed: 03/14/2014 Elsevier Interactive Patient Education [employer].

## 2015-06-19 NOTE — ED Notes (Signed)
Pt reports to the ED from [facility] for irregular HR. Pt reports he went to the [facility] for f/u from UTI and new cough. Pt had EKG done and was found to be in an abnormal rhythm. Pt also reports SOB with minimal exertion. Pt A&Ox4, resp e/u, and skin warm and dry.

## 2016-07-06 DIAGNOSIS — M2041 Other hammer toe(s) (acquired), right foot: Secondary | ICD-10-CM | POA: Diagnosis not present

## 2016-07-06 DIAGNOSIS — M79671 Pain in right foot: Secondary | ICD-10-CM | POA: Diagnosis not present

## 2016-11-21 IMAGING — CR DG CHEST 2V
2 series · 2 of 2 positions shown · non-contrast
Comparison: 12/27/2004

CLINICAL DATA: Shortness of breath and cough for 1 month. History
of an abnormal ECG.

EXAM:
CHEST  2 VIEW

[chest pa]
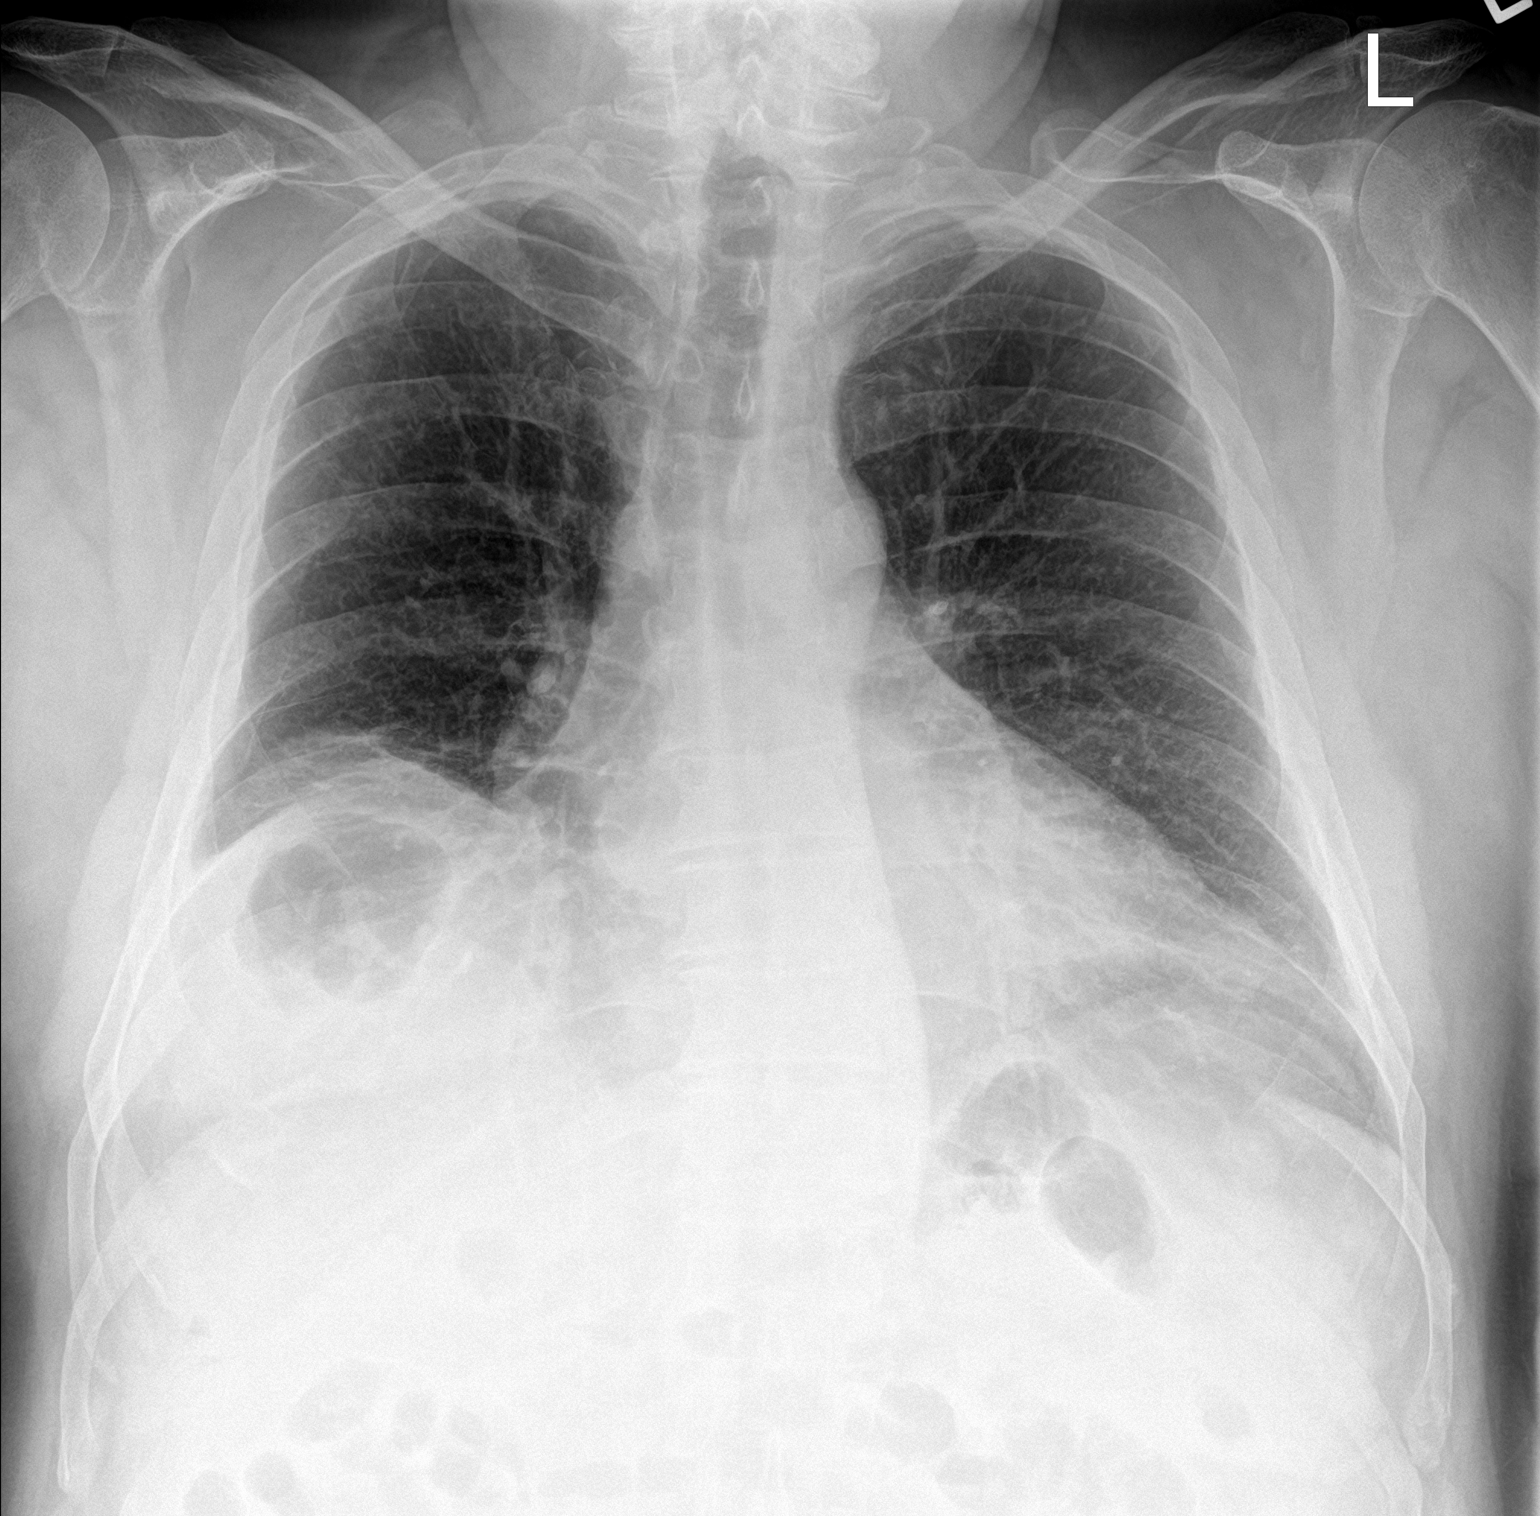

[chest lat]
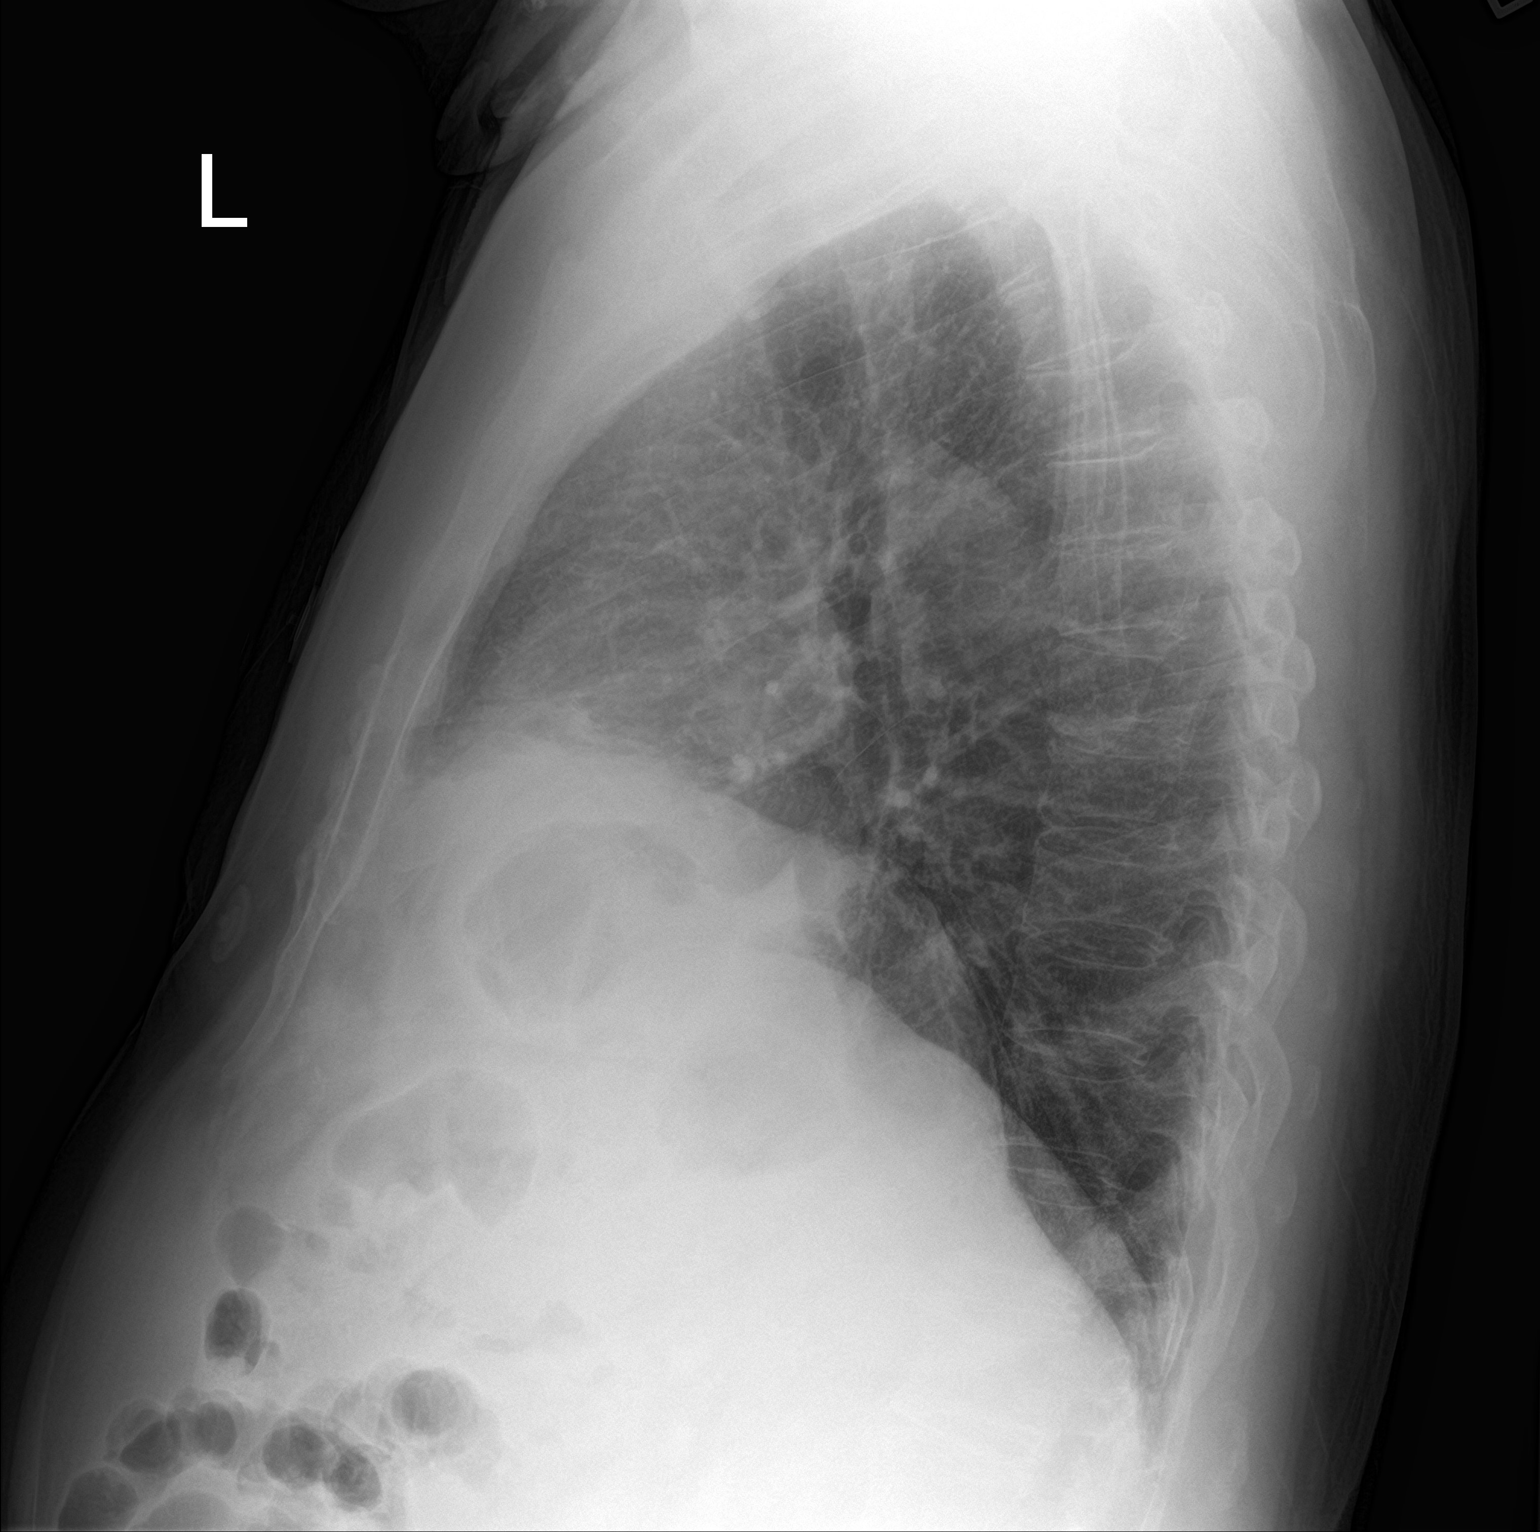

[2 of 2 positions shown; findings below may reference images not displayed]

FINDINGS: Elevation of the right hemidiaphragm with evidence of right basilar
atelectasis. Upper lungs are clear. Heart size is upper limits of
normal. No pleural effusions. Mild degenerative endplate changes in
the thoracic spine.
IMPRESSION: Volume loss at the right lung base with elevation of the right
hemidiaphragm.

## 2017-05-09 DIAGNOSIS — R69 Illness, unspecified: Secondary | ICD-10-CM | POA: Diagnosis not present

## 2017-12-14 ENCOUNTER — Ambulatory Visit: Payer: Medicare HMO | Admitting: Family Medicine

## 2017-12-14 ENCOUNTER — Encounter: Payer: Self-pay | Admitting: Family Medicine

## 2017-12-14 VITALS — BP 110/70 | HR 60 | Temp 98.4°F | Ht 71.5 in | Wt 262.7 lb

## 2017-12-14 DIAGNOSIS — M545 Low back pain, unspecified: Secondary | ICD-10-CM | POA: Insufficient documentation

## 2017-12-14 DIAGNOSIS — K219 Gastro-esophageal reflux disease without esophagitis: Secondary | ICD-10-CM

## 2017-12-14 DIAGNOSIS — G8929 Other chronic pain: Secondary | ICD-10-CM

## 2017-12-14 DIAGNOSIS — Z7689 Persons encountering health services in other specified circumstances: Secondary | ICD-10-CM

## 2017-12-14 DIAGNOSIS — E785 Hyperlipidemia, unspecified: Secondary | ICD-10-CM

## 2017-12-14 DIAGNOSIS — M25511 Pain in right shoulder: Secondary | ICD-10-CM | POA: Diagnosis not present

## 2017-12-14 DIAGNOSIS — I10 Essential (primary) hypertension: Secondary | ICD-10-CM | POA: Diagnosis not present

## 2017-12-14 DIAGNOSIS — M25512 Pain in left shoulder: Secondary | ICD-10-CM | POA: Diagnosis not present

## 2017-12-14 DIAGNOSIS — G2581 Restless legs syndrome: Secondary | ICD-10-CM | POA: Diagnosis not present

## 2017-12-14 NOTE — Progress Notes (Signed)
HPI:  [name] [name] is here to establish care. Sees physician at [facility] in [place] for chronic care and does all of his care an prescriptions there, Dr. Jari [name]. However, he was advised to obtain PCP in the community as well for yearly exam and acute issues. His wife see me. PMH GERd, RLS, HTN, OSA, RLS, obesity. [facility]. Reports taking asa chronically but no indication. Reports taking PPI twice daily chronically without recent symptoms. Also has chronic low back pain and shoulder issues. Currently do physical therapy. [facility].   ROS negative for unless reported above: fevers, unintentional weight loss, hearing or vision loss, chest pain, palpitations, struggling to breath, hemoptysis, melena, hematochezia, hematuria, falls, loc, si, thoughts of self harm  Past Medical History:  Diagnosis Date  . Arthritis   . Back pain   . GERD (gastroesophageal reflux disease)   . Hypercholesteremia   . Hypertension   . Neuropathy     Past Surgical History:  Procedure Laterality Date  . DENTAL SURGERY    . HAND SURGERY      History reviewed. No pertinent family history.  Social History   Socioeconomic History  . Marital status: Married    Spouse name: Not on file  . Number of children: Not on file  . Years of education: Not on file  . Highest education level: Not on file  Occupational History  . Not on file  Social Needs  . Financial resource strain: Not on file  . Food insecurity:    Worry: Not on file    Inability: Not on file  . Transportation needs:    Medical: Not on file    Non-medical: Not on file  Tobacco Use  . Smoking status: Former Smoker    Types: Cigarettes  . Smokeless tobacco: Never Used  Substance and Sexual Activity  . Alcohol use: No  . Drug use: No  . Sexual activity: Not on file  Lifestyle  . Physical activity:    Days per week: Not on file    Minutes per session: Not on file  .  Stress: Not on file  Relationships  . Social connections:    Talks on phone: Not on file    Gets together: Not on file    Attends religious service: Not on file    Active member of club or organization: Not on file    Attends meetings of clubs or organizations: Not on file    Relationship status: Not on file  Other Topics Concern  . Not on file  Social History Narrative  . Not on file     Current Outpatient Medications:  .  aspirin EC 81 MG tablet, Take 81 mg by mouth at bedtime., Disp: , Rfl:  .  atenolol (TENORMIN) 25 MG tablet, Take 25 mg by mouth 2 (two) times daily., Disp: , Rfl:  .  cholecalciferol (VITAMIN D) 1000 UNITS tablet, Take 3,000 Units by mouth daily., Disp: , Rfl:  .  methocarbamol (ROBAXIN) 750 MG tablet, Take 750 mg by mouth at bedtime., Disp: , Rfl:  .  omeprazole (PRILOSEC) 20 MG capsule, Take 20 mg by mouth at bedtime., Disp: , Rfl:  .  rOPINIRole (REQUIP) 0.5 MG tablet, Take 0.5 mg by mouth at bedtime., Disp: , Rfl:  .  simvastatin (ZOCOR) 20 MG tablet, Take 20 mg by mouth daily at 6 PM., Disp: , Rfl:   EXAM:  Vitals:   12/14/17  1326  BP: 110/70  Pulse: 60  Temp: 98.4 F (36.9 C)    Body mass index is 36.13 kg/m.  GENERAL: vitals reviewed and listed above, alert, oriented, appears well hydrated and in no acute distress  HEENT: atraumatic, conjunttiva clear, no obvious abnormalities on inspection of external nose and ears  NECK: no obvious masses on inspection  LUNGS: clear to auscultation bilaterally, no wheezes, rales or rhonchi, good air movement  CV: HRRR, no peripheral edema  MS: moves all extremities without noticeable abnormality  PSYCH: pleasant and cooperative, no obvious depression or anxiety  ASSESSMENT AND PLAN:  Discussed the following assessment and plan:  Encounter to establish care  Hyperlipidemia, unspecified hyperlipidemia type  Essential hypertension  Gastroesophageal reflux disease, esophagitis presence not  specified  Chronic low back pain, unspecified back pain laterality, with sciatica presence unspecified  RLS (restless legs syndrome)  Chronic pain of both shoulders -We reviewed the PMH, PSH, FH, SH, Meds and Allergies. -We provided refills for any medications we will prescribe as needed. -follow up for AWV/CPE - he plans to bring list of preventive care so can update our system. -several suggestions regarding his medications in pt instructions, but advised he check with managing doc at [facility] first.  -Patient advised to return or notify a doctor immediately if symptoms worsen or persist or new concerns arise.  Patient Instructions  BEFORE YOU LEAVE: -follow up: AWV with [name] [name] and CPE with Dr. Maudie [name] in 3 months - bring recent labs from [facility] to appointment  Ask your [place] doctor if it would be ok to stop the aspirin.  Try to use the muscle relaxer as needed, rather then daily.  See if your gastroenterologist is ok with cutting back on the prilosec to 20mg  daily  Get the sleep study!  We recommend the following healthy lifestyle for LIFE: 1) Small portions. But, make sure to get regular (at least 3 per day), healthy meals and small healthy snacks if needed.  2) Eat a healthy clean diet.   TRY TO EAT: -at least 5-7 servings of low sugar, colorful, and nutrient rich vegetables per day (not corn, potatoes or bananas.) -berries are the best choice if you wish to eat fruit (only eat small amounts if trying to reduce weight)  -lean meets (fish, white meat of chicken or [place]) -vegan proteins for some meals - beans or tofu, whole grains, nuts and seeds -Replace bad fats with good fats - good fats include: fish, nuts and seeds, canola oil, olive oil -small amounts of low fat or non fat dairy -small amounts of100 % whole grains - check the lables -drink plenty of water  AVOID: -SUGAR, sweets, anything with added sugar, corn syrup or sweeteners - must read labels as even foods advertised as  "healthy" often are loaded with sugar -if you must have a sweetener, small amounts of stevia may be best -sweetened beverages and artificially sweetened beverages -simple starches (rice, bread, potatoes, pasta, chips, etc - small amounts of 100% whole grains are ok) -red meat, pork, butter -fried foods, fast food, processed food, excessive dairy, eggs and coconut.  3)Get at least 150 minutes of sweaty aerobic exercise per week.  4)Reduce stress - consider counseling, meditation and relaxation to balance other aspects of your life.      Lucretia Kern

## 2017-12-14 NOTE — Patient Instructions (Addendum)
BEFORE YOU LEAVE: -follow up: AWV with susan and CPE with [name] in 3 months - bring recent labs from [facility] to appointment  Ask your [place] doctor if it would be ok to stop the aspirin.  Try to use the muscle relaxer as needed, rather then daily.  See if your gastroenterologist is ok with cutting back on the prilosec to 20mg  daily  Get the sleep study!  We recommend the following healthy lifestyle for LIFE: 1) Small portions. But, make sure to get regular (at least 3 per day), healthy meals and small healthy snacks if needed.  2) Eat a healthy clean diet.   TRY TO EAT: -at least 5-7 servings of low sugar, colorful, and nutrient rich vegetables per day (not corn, potatoes or bananas.) -berries are the best choice if you wish to eat fruit (only eat small amounts if trying to reduce weight)  -lean meets (fish, white meat of chicken or [place]) -vegan proteins for some meals - beans or tofu, whole grains, nuts and seeds -Replace bad fats with good fats - good fats include: fish, nuts and seeds, canola oil, olive oil -small amounts of low fat or non fat dairy -small amounts of100 % whole grains - check the lables -drink plenty of water  AVOID: -SUGAR, sweets, anything with added sugar, corn syrup or sweeteners - must read labels as even foods advertised as "healthy" often are loaded with sugar -if you must have a sweetener, small amounts of stevia may be best -sweetened beverages and artificially sweetened beverages -simple starches (rice, bread, potatoes, pasta, chips, etc - small amounts of 100% whole grains are ok) -red meat, pork, butter -fried foods, fast food, processed food, excessive dairy, eggs and coconut.  3)Get at least 150 minutes of sweaty aerobic exercise per week.  4)Reduce stress - consider counseling, meditation and relaxation to balance other aspects of your life.

## 2018-01-09 LAB — HEPATIC FUNCTION PANEL
ALT: 35 (ref 10–40)
AST: 19 (ref 14–40)
Bilirubin, Direct: 0.1 (ref 0.01–0.4)
Bilirubin, Total: 3.7

## 2018-01-09 LAB — LIPID PANEL
CHOLESTEROL: 148 (ref 0–200)
HDL: 46 (ref 35–70)
LDL Cholesterol: 81
TRIGLYCERIDES: 106 (ref 40–160)

## 2018-01-09 LAB — CBC AND DIFFERENTIAL
HCT: 46 (ref 41–53)
HEMOGLOBIN: 15.6 (ref 13.5–17.5)
Platelets: 255 (ref 150–399)
WBC: 7.5

## 2018-01-09 LAB — BASIC METABOLIC PANEL
BUN: 12 (ref 4–21)
CREATININE: 1.2 (ref 0.6–1.3)
Glucose: 101
Potassium: 4.2 (ref 3.4–5.3)
SODIUM: 142 (ref 137–147)

## 2018-03-20 ENCOUNTER — Ambulatory Visit (INDEPENDENT_AMBULATORY_CARE_PROVIDER_SITE_OTHER): Payer: Medicare HMO

## 2018-03-20 VITALS — BP 130/80 | HR 69 | Ht 72.0 in | Wt 264.0 lb

## 2018-03-20 DIAGNOSIS — Z0000 Encounter for general adult medical examination without abnormal findings: Secondary | ICD-10-CM | POA: Diagnosis not present

## 2018-03-20 NOTE — Patient Instructions (Addendum)
[name] [name] , Thank you for taking time to come for your Medicare Wellness Visit. I appreciate your ongoing commitment to your health goals. Please review the following plan we discussed and let me know if I can assist you in the future.   Please make an apt with [name] to discuss your left shoulder if you would like too.  To [facility] Immunizations; pneumonia series, your flu vaccines, your tetanus and shingles ( shingrix ) Colonoscopy report   Shingrix is a vaccine for the prevention of Shingles in Adults 50 and older.  If you are on Medicare, the shingrix is covered under your Part D plan, so you will take both of the vaccines in the series at your pharmacy. Please check with your benefits regarding applicable copays or out of pocket expenses.  The Shingrix is given in 2 vaccines approx 8 weeks apart. You must receive the 2nd dose prior to 6 months from receipt of the first. Please have the pharmacist print out you Immunization  dates for our office records   Ask the [place] if they did an AAA check due to smoking hx Men Ages 43 to 24 Years who Have Ever Smoked  The USPSTF recommends one-time screening for abdominal aortic aneurysm (AAA) with ultrasonography in men ages 55 to 76 years who have ever smoked.     These are the goals we discussed: Goals    . Exercise 150 min/wk Moderate Activity     Will keep walking and increase if able and increase arm cranks with bike and blades        This is a list of the screening recommended for you and due dates:  Health Maintenance  Topic Date Due  .  Hepatitis C: One time screening is recommended by Center for Disease Control  (CDC) for  adults born from [birth year] through 1966.   25-Sep-1945  . Colon Cancer Screening  01/08/1995  . Pneumonia vaccines (1 of 2 - PCV13) 01/07/2010  . Tetanus Vaccine  07/25/2016  . Flu Shot  02/22/2018    '  Fall Prevention in the Home Falls can cause injuries. They can happen  to people of all ages. There are many things you can do to make your home safe and to help prevent falls. What can I do on the outside of my home?  Regularly fix the edges of walkways and driveways and fix any cracks.  Remove anything that might make you trip as you walk through a door, such as a raised step or threshold.  Trim any bushes or trees on the path to your home.  Use bright outdoor lighting.  Clear any walking paths of anything that might make someone trip, such as rocks or tools.  Regularly check to see if handrails are loose or broken. Make sure that both sides of any steps have handrails.  Any raised decks and porches should have guardrails on the edges.  Have any leaves, snow, or ice cleared regularly.  Use sand or salt on walking paths during winter.  Clean up any spills in your garage right away. This includes oil or grease spills. What can I do in the bathroom?  Use night lights.  Install grab bars by the toilet and in the tub and shower. Do not use towel bars as grab bars.  Use non-skid mats or decals in the tub or shower.  If you need to sit down in the shower, use a plastic,  non-slip stool.  Keep the floor dry. Clean up any water that spills on the floor as soon as it happens.  Remove soap buildup in the tub or shower regularly.  Attach bath mats securely with double-sided non-slip rug tape.  Do not have throw rugs and other things on the floor that can make you trip. What can I do in the bedroom?  Use night lights.  Make sure that you have a light by your bed that is easy to reach.  Do not use any sheets or blankets that are too big for your bed. They should not hang down onto the floor.  Have a firm chair that has side arms. You can use this for support while you get dressed.  Do not have throw rugs and other things on the floor that can make you trip. What can I do in the kitchen?  Clean up any spills right away.  Avoid walking on wet  floors.  Keep items that you use a lot in easy-to-reach places.  If you need to reach something above you, use a strong step stool that has a grab bar.  Keep electrical cords out of the way.  Do not use floor polish or wax that makes floors slippery. If you must use wax, use non-skid floor wax.  Do not have throw rugs and other things on the floor that can make you trip. What can I do with my stairs?  Do not leave any items on the stairs.  Make sure that there are handrails on both sides of the stairs and use them. Fix handrails that are broken or loose. Make sure that handrails are as long as the stairways.  Check any carpeting to make sure that it is firmly attached to the stairs. Fix any carpet that is loose or worn.  Avoid having throw rugs at the top or bottom of the stairs. If you do have throw rugs, attach them to the floor with carpet tape.  Make sure that you have a light switch at the top of the stairs and the bottom of the stairs. If you do not have them, ask someone to add them for you. What else can I do to help prevent falls?  Wear shoes that: ? Do not have high heels. ? Have rubber bottoms. ? Are comfortable and fit you well. ? Are closed at the toe. Do not wear sandals.  If you use a stepladder: ? Make sure that it is fully opened. Do not climb a closed stepladder. ? Make sure that both sides of the stepladder are locked into place. ? Ask someone to hold it for you, if possible.  Clearly mark and make sure that you can see: ? Any grab bars or handrails. ? First and last steps. ? Where the edge of each step is.  Use tools that help you move around (mobility aids) if they are needed. These include: ? Canes. ? Walkers. ? Scooters. ? Crutches.  Turn on the lights when you go into a dark area. Replace any light bulbs as soon as they burn out.  Set up your furniture so you have a clear path. Avoid moving your furniture around.  If any of your floors are  uneven, fix them.  If there are any pets around you, be aware of where they are.  Review your medicines with your doctor. Some medicines can make you feel dizzy. This can increase your chance of falling. Ask your doctor what other things  that you can do to help prevent falls. This information is not intended to replace advice given to you by your health care provider. Make sure you discuss any questions you have with your health care provider. Document Released: 05/07/2009 Document Revised: 12/17/2015 Document Reviewed: 08/15/2014 Elsevier Interactive Patient Education  2018 [place] Maintenance, Male A healthy lifestyle and preventive care is important for your health and wellness. Ask your health care provider about what schedule of regular examinations is right for you. What should I know about weight and diet? Eat a Healthy Diet  Eat plenty of vegetables, fruits, whole grains, low-fat dairy products, and lean protein.  Do not eat a lot of foods high in solid fats, added sugars, or salt.  Maintain a Healthy Weight Regular exercise can help you achieve or maintain a healthy weight. You should:  Do at least 150 minutes of exercise each week. The exercise should increase your heart rate and make you sweat (moderate-intensity exercise).  Do strength-training exercises at least twice a week.  Watch Your Levels of Cholesterol and Blood Lipids  Have your blood tested for lipids and cholesterol every 5 years starting at 73 years of age. If you are at high risk for heart disease, you should start having your blood tested when you are 73 years old. You may need to have your cholesterol levels checked more often if: ? Your lipid or cholesterol levels are high. ? You are older than 73 years of age. ? You are at high risk for heart disease.  What should I know about cancer screening? Many types of cancers can be detected early and may often be prevented. Lung Cancer  You  should be screened every year for lung cancer if: ? You are a current smoker who has smoked for at least 30 years. ? You are a former smoker who has quit within the past 15 years.  Talk to your health care provider about your screening options, when you should start screening, and how often you should be screened.  Colorectal Cancer  Routine colorectal cancer screening usually begins at 73 years of age and should be repeated every 5-10 years until you are 73 years old. You may need to be screened more often if early forms of precancerous polyps or small growths are found. Your health care provider may recommend screening at an earlier age if you have risk factors for colon cancer.  Your health care provider may recommend using home test kits to check for hidden blood in the stool.  A small camera at the end of a tube can be used to examine your colon (sigmoidoscopy or colonoscopy). This checks for the earliest forms of colorectal cancer.  Prostate and Testicular Cancer  Depending on your age and overall health, your health care provider may do certain tests to screen for prostate and testicular cancer.  Talk to your health care provider about any symptoms or concerns you have about testicular or prostate cancer.  Skin Cancer  Check your skin from head to toe regularly.  Tell your health care provider about any new moles or changes in moles, especially if: ? There is a change in a mole's size, shape, or color. ? You have a mole that is larger than a pencil eraser.  Always use sunscreen. Apply sunscreen liberally and repeat throughout the day.  Protect yourself by wearing long sleeves, pants, a wide-brimmed hat, and sunglasses when outside.  What should I know about heart disease,  diabetes, and high blood pressure?  If you are 42-56 years of age, have your blood pressure checked every 3-5 years. If you are 60 years of age or older, have your blood pressure checked every year. You  should have your blood pressure measured twice-once when you are at a hospital or clinic, and once when you are not at a hospital or clinic. Record the average of the two measurements. To check your blood pressure when you are not at a hospital or clinic, you can use: ? An automated blood pressure machine at a pharmacy. ? A home blood pressure monitor.  Talk to your health care provider about your target blood pressure.  If you are between 18-79 years old, ask your health care provider if you should take aspirin to prevent heart disease.  Have regular diabetes screenings by checking your fasting blood sugar level. ? If you are at a normal weight and have a low risk for diabetes, have this test once every three years after the age of 34. ? If you are overweight and have a high risk for diabetes, consider being tested at a younger age or more often.  A one-time screening for abdominal aortic aneurysm (AAA) by ultrasound is recommended for men aged 8-75 years who are current or former smokers. What should I know about preventing infection? Hepatitis B If you have a higher risk for hepatitis B, you should be screened for this virus. Talk with your health care provider to find out if you are at risk for hepatitis B infection. Hepatitis C Blood testing is recommended for:  Everyone born from [birth year] through 1966.  Anyone with known risk factors for hepatitis C.  Sexually Transmitted Diseases (STDs)  You should be screened each year for STDs including gonorrhea and chlamydia if: ? You are sexually active and are younger than 73 years of age. ? You are older than 73 years of age and your health care provider tells you that you are at risk for this type of infection. ? Your sexual activity has changed since you were last screened and you are at an increased risk for chlamydia or gonorrhea. Ask your health care provider if you are at risk.  Talk with your health care provider about whether you are  at high risk of being infected with HIV. Your health care provider may recommend a prescription medicine to help prevent HIV infection.  What else can I do?  Schedule regular health, dental, and eye exams.  Stay current with your vaccines (immunizations).  Do not use any tobacco products, such as cigarettes, chewing tobacco, and e-cigarettes. If you need help quitting, ask your health care provider.  Limit alcohol intake to no more than 2 drinks per day. One drink equals 12 ounces of beer, 5 ounces of wine, or 1 ounces of hard liquor.  Do not use street drugs.  Do not share needles.  Ask your health care provider for help if you need support or information about quitting drugs.  Tell your health care provider if you often feel depressed.  Tell your health care provider if you have ever been abused or do not feel safe at home. This information is not intended to replace advice given to you by your health care provider. Make sure you discuss any questions you have with your health care provider. Document Released: 01/07/2008 Document Revised: 03/09/2016 Document Reviewed: 04/14/2015 Elsevier Interactive Patient Education  [employer].

## 2018-03-20 NOTE — Progress Notes (Signed)
Subjective:   [name] [name] is a 72 y.o. male who presents for Medicare Annual (Subsequent) preventive examination.  Reports health as good  5/23 OV with [name] to establish care  Has MD at the [facility]; to bring list of preventive care to update  Has been working on his shoulder on the left  Operated on right shoulder x 72 yo  Uses the TENs unit at hs and that helps Or hand massager   Has one dtr and 2 grand sons One is a sophomore in [place] One is  A sophomore in [place] 33  Will eat sweets;  Eats vegetables and meat   Exercise Walks 1.5 to 2 miles; 30 to 45 minutes   Health Maintenance Due  Topic Date Due  . INFLUENZA VACCINE  02/22/2018   [facility]- [facility] Will get his pneumonia vaccine history Will get his tetanus hx   Will inquire about the shingrix at the [facility]  Colonoscopy - Dec 2019 due      Objective:     Vitals: BP 130/80   Pulse 69   Ht 6' (1.829 m)   Wt 264 lb (119.7 kg)   SpO2 95%   BMI 35.80 kg/m   Body mass index is 35.8 kg/m.  Advanced Directives 03/20/2018 06/19/2015  Does Patient Have a Medical Advance Directive? No No  Would patient like information on creating a medical advance directive? - No - patient declined information   He is working on completing this. Has the forms   Tobacco Social History   Tobacco Use  Smoking Status Former Smoker  . Packs/day: 0.50  . Years: 50.00  . Pack years: 25.00  . Types: Cigarettes, E-cigarettes  Smokeless Tobacco Former Systems developer  . Quit date: 05/25/2008  Tobacco Comment   quit 10 to 12 years      Counseling given: Yes Comment: quit 10 to 12 years    Clinical Intake:    Past Medical History:  Diagnosis Date  . Arthritis   . Back pain   . GERD (gastroesophageal reflux disease)   . Hypercholesteremia   . Hypertension   . Neuropathy    Past Surgical History:  Procedure Laterality Date  . DENTAL SURGERY    . HAND SURGERY     No family history on  file. Social History   Socioeconomic History  . Marital status: Married    Spouse name: Not on file  . Number of children: Not on file  . Years of education: Not on file  . Highest education level: Not on file  Occupational History  . Not on file  Social Needs  . Financial resource strain: Not on file  . Food insecurity:    Worry: Not on file    Inability: Not on file  . Transportation needs:    Medical: Not on file    Non-medical: Not on file  Tobacco Use  . Smoking status: Former Smoker    Packs/day: 0.50    Years: 50.00    Pack years: 25.00    Types: Cigarettes, E-cigarettes  . Smokeless tobacco: Former Systems developer    Quit date: 05/25/2008  . Tobacco comment: quit 10 to 12 years   Substance and Sexual Activity  . Alcohol use: No  . Drug use: No  . Sexual activity: Not on file  Lifestyle  . Physical activity:    Days per week: Not on file    Minutes per session:  Not on file  . Stress: Not on file  Relationships  . Social connections:    Talks on phone: Not on file    Gets together: Not on file    Attends religious service: Not on file    Active member of club or organization: Not on file    Attends meetings of clubs or organizations: Not on file    Relationship status: Not on file  Other Topics Concern  . Not on file  Social History Narrative  . Not on file    Outpatient Encounter Medications as of 03/20/2018  Medication Sig  . atenolol (TENORMIN) 25 MG tablet Take 25 mg by mouth 2 (two) times daily.  . cholecalciferol (VITAMIN D) 1000 UNITS tablet Take 3,000 Units by mouth daily.  . methocarbamol (ROBAXIN) 750 MG tablet Take 750 mg by mouth at bedtime.  Marland Kitchen omeprazole (PRILOSEC) 20 MG capsule Take 20 mg by mouth at bedtime.  Marland Kitchen rOPINIRole (REQUIP) 0.5 MG tablet Take 0.5 mg by mouth at bedtime.  . simvastatin (ZOCOR) 20 MG tablet Take 20 mg by mouth daily at 6 PM.  . aspirin EC 81 MG tablet Take 81 mg by mouth at bedtime.   No facility-administered encounter  medications on file as of 03/20/2018.     Activities of Daily Living In your present state of health, do you have any difficulty performing the following activities: 03/20/2018  Hearing? N  Comment has been treated in right ear   Vision? N  Difficulty concentrating or making decisions? N  Walking or climbing stairs? N  Dressing or bathing? N  Doing errands, shopping? N  Preparing Food and eating ? N  Using the Toilet? N  In the past six months, have you accidently leaked urine? N  Do you have problems with loss of bowel control? N  Managing your Medications? N  Managing your Finances? N  Housekeeping or managing your Housekeeping? N  Some recent data might be hidden    Patient Care Team: Lucretia Kern, DO as PCP - General (Family Medicine)    Assessment:   This is a routine wellness examination for Mat.  Exercise Activities and Dietary recommendations Current Exercise Habits: Home exercise routine, Time (Minutes): 30, Frequency (Times/Week): 5, Weekly Exercise (Minutes/Week): 150, Intensity: Mild  Goals    . Exercise 150 min/wk Moderate Activity     Will keep walking and increase if able and increase arm cranks with bike and blades        Fall Risk Fall Risk  03/20/2018  Falls in the past year? No  Comment fell one time when taking medicine and got choked       Depression Screen PHQ 2/9 Scores 03/20/2018  PHQ - 2 Score 0     Cognitive Function     Ad8 score reviewed for issues:  Issues making decisions:  Less interest in hobbies / activities:  Repeats questions, stories (family complaining):  Trouble using ordinary gadgets (microwave, computer, phone):  Forgets the month or year:   Mismanaging finances:   Remembering appts:  Daily problems with thinking and/or memory: Ad8 score is=0    There is no immunization history on file for this patient.   Screening Tests Health Maintenance  Topic Date Due  . INFLUENZA VACCINE  02/22/2018  .  COLONOSCOPY  07/24/2018 (Originally 01/08/1995)  . TETANUS/TDAP  07/24/2018 (Originally 07/25/2016)  . Hepatitis C Screening  07/24/2018 (Originally [birth year])  . PNA vac Low Risk Adult (1 of 2 - PCV13)  07/24/2018 (Originally 01/07/2010)          Plan:      PCP Notes   Health Maintenance Brought labs in but did not bring his preventive health records  Discussed Hep C- [facility] Will get his pneumonia vaccine history Will get his tetanus hx   Will check to see if the [facility] checked for AAA due to smoking hx   Will inquire about the [facility]rix at the [facility]  Colonoscopy - Dec 2019 due    Abnormal Screens  BMI; states he likes to eat Is exercising  Chol 148; trig 106; hdl 46 and ldl 81 from the [place]   Referrals  none  Patient concerns; C/o of pain in his left shoulder with neg MRI; [facility] and [facility]  Encouraged to make an apt with [name]   Nurse Concerns; As noted   Next PCP apt Agreed to schedule if he needed apt. Was recently seen to establish care        I have personally reviewed and noted the following in the patient's chart:   . Medical and social history . Use of alcohol, tobacco or illicit drugs  . Current medications and supplements . Functional ability and status . Nutritional status . Physical activity . Advanced directives . List of other physicians . Hospitalizations, surgeries, and ER visits in previous 12 months . Vitals . Screenings to include cognitive, depression, and falls . Referrals and appointments  In addition, I have reviewed and discussed with patient certain preventive protocols, quality metrics, and best practice recommendations. A written personalized care plan for preventive services as well as general preventive health recommendations were provided to patient.     Wynetta Fines, RN  03/20/2018

## 2018-03-21 ENCOUNTER — Other Ambulatory Visit: Payer: Self-pay

## 2018-03-21 NOTE — Progress Notes (Signed)
[name] [name] R Manna Gose, DO  

## 2018-04-05 DIAGNOSIS — R69 Illness, unspecified: Secondary | ICD-10-CM | POA: Diagnosis not present

## 2018-07-13 ENCOUNTER — Encounter: Payer: Self-pay | Admitting: Family Medicine

## 2018-08-07 ENCOUNTER — Ambulatory Visit (INDEPENDENT_AMBULATORY_CARE_PROVIDER_SITE_OTHER): Payer: Medicare HMO

## 2018-08-07 ENCOUNTER — Encounter: Payer: Self-pay | Admitting: Family Medicine

## 2018-08-07 ENCOUNTER — Ambulatory Visit (INDEPENDENT_AMBULATORY_CARE_PROVIDER_SITE_OTHER): Payer: Medicare HMO | Admitting: Family Medicine

## 2018-08-07 VITALS — BP 130/84 | HR 64 | Temp 98.0°F | Resp 12 | Ht 72.0 in | Wt 262.1 lb

## 2018-08-07 DIAGNOSIS — J988 Other specified respiratory disorders: Secondary | ICD-10-CM

## 2018-08-07 DIAGNOSIS — R05 Cough: Secondary | ICD-10-CM

## 2018-08-07 DIAGNOSIS — K219 Gastro-esophageal reflux disease without esophagitis: Secondary | ICD-10-CM | POA: Diagnosis not present

## 2018-08-07 DIAGNOSIS — R059 Cough, unspecified: Secondary | ICD-10-CM

## 2018-08-07 MED ORDER — DOXYCYCLINE HYCLATE 100 MG PO TABS: 100.0000 mg | ORAL_TABLET | Freq: Two times a day (BID) | ORAL | 0 refills | Status: AC

## 2018-08-07 MED ORDER — BENZONATATE 100 MG PO CAPS: 200.0000 mg | ORAL_CAPSULE | Freq: Two times a day (BID) | ORAL | 0 refills | Status: AC | PRN

## 2018-08-07 NOTE — Patient Instructions (Signed)
  [name] I have seen you today for an acute visit.  A few things to remember from today's visit:   Cough - Plan: benzonatate (TESSALON) 100 MG capsule, DG Chest 2 View  Gastroesophageal reflux disease, esophagitis presence not specified  Respiratory tract infection - Plan: doxycycline (VIBRA-TABS) 100 MG tablet   If medications prescribed today, they will not be refill upon request, a follow up appointment with PCP will be necessary to discuss continuation of of treatment if appropriate.   I do not think you need antibiotics today. Cough can be related to your acid reflux.  Antibiotics can cause gastrointestinal symptoms, so take medication with food.  In general please monitor for signs of worsening symptoms and seek immediate medical attention if any concerning.  If symptoms are not resolved in 1-2 weeks you should schedule a follow up appointment with your doctor, before if needed.  I hope you get better soon!

## 2018-08-07 NOTE — Progress Notes (Signed)
ACUTE VISIT  HPI:  Chief Complaint  Patient presents with  . Cough    x 3 weeks    [name] is a 73 y.o.male here today complaining of 3 to 4 weeks of respiratory symptoms. Productive cough with clear sputum, he denies hemoptysis but occasionally he sees some tinge of blood mixed with mucus, usually when he coughs hard. . He has felt some chills and body aches for the past 2 days but no fever. Body aches are aggravated by coughing spells.  He denies dyspnea or wheezing. He has not identified exacerbating or alleviating factors. Cough is interfering with his sleep.  He is requesting antibiotic treatment. He states that he does not want Z-Pak because he does work, he would prefer amoxicillin.  Cough  This is a new problem. The current episode started 1 to 4 weeks ago. The problem has been unchanged. The cough is productive of sputum. Associated symptoms include myalgias, nasal congestion, postnasal drip and rhinorrhea. Pertinent negatives include no chills, ear congestion, ear pain, eye redness, fever, headaches, heartburn, hemoptysis, rash, sore throat, shortness of breath or wheezing. The symptoms are aggravated by lying down and exercise. Risk factors for lung disease include smoking/tobacco exposure. He has tried OTC cough suppressant for the symptoms. There is no history of COPD or environmental allergies.   No Hx of recent travel. No sick contact. No known insect bite.  Hx of allergies: Denies. Former smoker.  OTC medications for this problem: Mucinex and TheraFlu  He has history of GERD and states that in the past he has had episodes of cough related to this problem. Currently he is on omeprazole. He denies heartburn, acid reflux, nausea, or abdominal pain.   Review of Systems  Constitutional: Positive for fatigue. Negative for activity change, appetite change, chills and fever.  HENT: Positive for congestion, postnasal drip and rhinorrhea.  Negative for ear pain, mouth sores, sore throat, trouble swallowing and voice change.   Eyes: Negative for discharge and redness.  Respiratory: Positive for cough. Negative for hemoptysis, chest tightness, shortness of breath and wheezing.   Gastrointestinal: Negative for abdominal pain, diarrhea, heartburn, nausea and vomiting.  Musculoskeletal: Positive for myalgias. Negative for gait problem.  Skin: Negative for rash.  Allergic/Immunologic: Negative for environmental allergies.  Neurological: Negative for weakness and headaches.  Psychiatric/Behavioral: Positive for sleep disturbance. Negative for confusion. The patient is nervous/anxious.       Current Outpatient Medications on File Prior to Visit  Medication Sig Dispense Refill  . Acetaminophen (TYLENOL ARTHRITIS PAIN PO) Take by mouth.    Marland Kitchen atenolol (TENORMIN) 25 MG tablet Take 25 mg by mouth 2 (two) times daily.    . cholecalciferol (VITAMIN D) 1000 UNITS tablet Take 3,000 Units by mouth daily.    . methocarbamol (ROBAXIN) 750 MG tablet Take 750 mg by mouth at bedtime.    Marland Kitchen omeprazole (PRILOSEC) 20 MG capsule Take 20 mg by mouth at bedtime.    Marland Kitchen rOPINIRole (REQUIP) 0.5 MG tablet Take 0.5 mg by mouth at bedtime.    . simvastatin (ZOCOR) 20 MG tablet Take 20 mg by mouth daily at 6 PM.    . diclofenac sodium (VOLTAREN) 1 % GEL Apply topically 4 (four) times daily.     . methylPREDNISolone (MEDROL) 4 MG tablet Take 4 mg by mouth daily.     No current facility-administered medications on file prior to visit.      Past Medical History:  Diagnosis Date  .  Arthritis   . Back pain   . GERD (gastroesophageal reflux disease)   . Hypercholesteremia   . Hypertension   . Neuropathy    No Known Allergies  Social History   Socioeconomic History  . Marital status: Married    Spouse name: Not on file  . Number of children: Not on file  . Years of education: Not on file  . Highest education level: Not on file  Occupational History   . Not on file  Social Needs  . Financial resource strain: Not on file  . Food insecurity:    Worry: Not on file    Inability: Not on file  . Transportation needs:    Medical: Not on file    Non-medical: Not on file  Tobacco Use  . Smoking status: Former Smoker    Packs/day: 0.50    Years: 50.00    Pack years: 25.00    Types: Cigarettes, E-cigarettes  . Smokeless tobacco: Former Systems developer    Quit date: 05/25/2008  . Tobacco comment: quit 10 to 12 years   Substance and Sexual Activity  . Alcohol use: No  . Drug use: No  . Sexual activity: Not on file  Lifestyle  . Physical activity:    Days per week: Not on file    Minutes per session: Not on file  . Stress: Not on file  Relationships  . Social connections:    Talks on phone: Not on file    Gets together: Not on file    Attends religious service: Not on file    Active member of club or organization: Not on file    Attends meetings of clubs or organizations: Not on file    Relationship status: Not on file  Other Topics Concern  . Not on file  Social History Narrative  . Not on file    Vitals:   08/07/18 0932  BP: 130/84  Pulse: 64  Resp: 12  Temp: 98 F (36.7 C)  SpO2: 96%   Body mass index is 35.55 kg/m.   Physical Exam  Nursing note and vitals reviewed. Constitutional: He is oriented to person, place, and time. He appears well-developed. He does not appear ill. No distress.  HENT:  Head: Normocephalic and atraumatic.  Nose: Right sinus exhibits no maxillary sinus tenderness and no frontal sinus tenderness. Left sinus exhibits no maxillary sinus tenderness and no frontal sinus tenderness.  Mouth/Throat: Oropharynx is clear and moist and mucous membranes are normal.  Eyes: Conjunctivae and EOM are normal.  Cardiovascular: Normal rate and regular rhythm.  No murmur heard. Respiratory: Effort normal and breath sounds normal. No stridor. No respiratory distress.  He has no cough during visit.  Lymphadenopathy:         Head (right side): No submandibular adenopathy present.       Head (left side): No submandibular adenopathy present.    He has no cervical adenopathy.  Neurological: He is alert and oriented to person, place, and time. He has normal strength.  Skin: Skin is warm. No rash noted. No erythema.  Psychiatric: His mood appears anxious.  Well groomed, good eye contact.      ASSESSMENT AND PLAN:   [name] [name] was seen today for cough.  Diagnoses and all orders for this visit:  Cough We discussed possible etiologies, lung auscultation is normal today. Explained that cough after URI can last a few weeks. Because he has had cough for 3 to 4 weeks, chest x-ray was ordered.  -  benzonatate (TESSALON) 100 MG capsule; Take 2 capsules (200 mg total) by mouth 2 (two) times daily as needed for up to 10 days. -     DG Chest 2 View; Future  Gastroesophageal reflux disease, esophagitis presence not specified This probably could be contributing to cough. No changes in omeprazole 20 mg daily, dose may need to be increased if cough is not better in a few weeks. GERD precautions to continue. Continue to follow with PCP.  Respiratory tract infection He insist in antibiotic treatment, explained that I will think he needs it at this time. We discussed some side effects of doxycycline. Continue monitoring for fever. Instructed about warning signs.  Follow-up with PCP if needed.  -     doxycycline (VIBRA-TABS) 100 MG tablet; Take 1 tablet (100 mg total) by mouth 2 (two) times daily for 7 days.    Return if symptoms worsen or fail to improve.       G. Martinique, MD  [facility]. [place] office.

## 2018-08-10 ENCOUNTER — Telehealth: Payer: Self-pay | Admitting: Gastroenterology

## 2018-08-10 NOTE — Telephone Encounter (Signed)
Records reviewed. OK for colonoscopy for personal history of adenomatous colon polyps.

## 2018-08-10 NOTE — Telephone Encounter (Signed)
Received [facility]  Last colon 08/2014 report is attached. Pt has requested for records to be reviewed by Dr. Fuller Plan because his wife is a patient of Dr. Fuller Plan. Records were placed on Dr. Lynne Leader desk for review.

## 2018-08-16 ENCOUNTER — Ambulatory Visit (AMBULATORY_SURGERY_CENTER): Payer: Self-pay

## 2018-08-16 VITALS — Ht 72.0 in | Wt 263.2 lb

## 2018-08-16 DIAGNOSIS — Z86010 Personal history of colonic polyps: Secondary | ICD-10-CM

## 2018-08-16 MED ORDER — MOVIPREP 100 G PO SOLR: 1.0000 | Freq: Once | ORAL | 0 refills | Status: AC

## 2018-08-16 NOTE — Progress Notes (Signed)
Per pt, no allergies to soy or egg products.Pt not taking any weight loss meds or using  O2 at home.  Pt refused emmi video. 

## 2018-08-29 ENCOUNTER — Ambulatory Visit (AMBULATORY_SURGERY_CENTER): Payer: No Typology Code available for payment source | Admitting: Gastroenterology

## 2018-08-29 ENCOUNTER — Encounter: Payer: Self-pay | Admitting: Gastroenterology

## 2018-08-29 VITALS — BP 103/58 | HR 64 | Temp 98.9°F | Resp 12 | Ht 72.0 in | Wt 262.0 lb

## 2018-08-29 DIAGNOSIS — D123 Benign neoplasm of transverse colon: Secondary | ICD-10-CM | POA: Diagnosis not present

## 2018-08-29 DIAGNOSIS — Z860100 Personal history of colon polyps, unspecified: Secondary | ICD-10-CM

## 2018-08-29 DIAGNOSIS — D128 Benign neoplasm of rectum: Secondary | ICD-10-CM | POA: Diagnosis not present

## 2018-08-29 DIAGNOSIS — E785 Hyperlipidemia, unspecified: Secondary | ICD-10-CM | POA: Diagnosis not present

## 2018-08-29 DIAGNOSIS — D127 Benign neoplasm of rectosigmoid junction: Secondary | ICD-10-CM | POA: Diagnosis not present

## 2018-08-29 DIAGNOSIS — D125 Benign neoplasm of sigmoid colon: Secondary | ICD-10-CM | POA: Diagnosis not present

## 2018-08-29 DIAGNOSIS — I10 Essential (primary) hypertension: Secondary | ICD-10-CM | POA: Diagnosis not present

## 2018-08-29 DIAGNOSIS — G4733 Obstructive sleep apnea (adult) (pediatric): Secondary | ICD-10-CM | POA: Diagnosis not present

## 2018-08-29 MED ORDER — SODIUM CHLORIDE 0.9 % IV SOLN: 500.0000 mL | Freq: Once | INTRAVENOUS | Status: DC

## 2018-08-29 NOTE — Progress Notes (Signed)
Report to PACU, RN, vss, BBS= Clear.  

## 2018-08-29 NOTE — Op Note (Signed)
[place] Patient Name: [name] [name] Procedure Date: 08/29/2018 10:44 AM MRN: [id number] Endoscopist: Ladene Artist , MD Age: 73 Referring MD:  Date of Birth: 1946-04-27 Gender: Male Account #: [id number] Procedure:                Colonoscopy Indications:              Surveillance: Personal history of adenomatous                            polyps on last colonoscopy > 3 years ago Medicines:                Monitored Anesthesia Care Procedure:                Pre-Anesthesia Assessment:                           - Prior to the procedure, a History and Physical                            was performed, and patient medications and                            allergies were reviewed. The patient's tolerance of                            previous anesthesia was also reviewed. The risks                            and benefits of the procedure and the sedation                            options and risks were discussed with the patient.                            All questions were answered, and informed consent                            was obtained. Prior Anticoagulants: The patient has                            taken no previous anticoagulant or antiplatelet                            agents. ASA Grade Assessment: II - A patient with                            mild systemic disease. After reviewing the risks                            and benefits, the patient was deemed in                            satisfactory condition to undergo the procedure.  After obtaining informed consent, the colonoscope                            was passed under direct vision. Throughout the                            procedure, the patient's blood pressure, pulse, and                            oxygen saturations were monitored continuously. The                            Colonoscope was introduced through the anus and                            advanced to the the  cecum, identified by                            appendiceal orifice and ileocecal valve. The                            ileocecal valve, appendiceal orifice, and rectum                            were photographed. The quality of the bowel                            preparation was good. The colonoscopy was performed                            without difficulty. The patient tolerated the                            procedure well. Scope In: 10:52:07 AM Scope Out: 11:07:30 AM Scope Withdrawal Time: 0 hours 13 minutes 27 seconds  Total Procedure Duration: 0 hours 15 minutes 23 seconds  Findings:                 The perianal and digital rectal examinations were                            normal.                           A 4 mm polyp was found in the transverse colon. The                            polyp was sessile. The polyp was removed with a                            cold biopsy forceps. Resection and retrieval were                            complete.  Three sessile polyps were found in the rectum,                            sigmoid colon and transverse colon. The polyps were                            6 to 7 mm in size. These polyps were removed with a                            cold snare. Resection and retrieval were complete.                           Many small-mouthed diverticula were found in the                            left colon.                           Internal hemorrhoids were found during                            retroflexion. The hemorrhoids were small and Grade                            I (internal hemorrhoids that do not prolapse).                           The exam was otherwise without abnormality on                            direct and retroflexion views. Complications:            No immediate complications. Estimated blood loss:                            None. Estimated Blood Loss:     Estimated blood loss: none. Impression:                - One 4 mm polyp in the transverse colon, removed                            with a cold biopsy forceps. Resected and retrieved.                           - Three 6 to 7 mm polyps in the rectum, in the                            sigmoid colon and in the transverse colon, removed                            with a cold snare. Resected and retrieved.                           - Diverticulosis in the left colon.                           -  Internal hemorrhoids.                           - The examination was otherwise normal on direct                            and retroflexion views. Recommendation:           - Repeat colonoscopy in 3 years for surveillance.                           - Patient has a contact number available for                            emergencies. The signs and symptoms of potential                            delayed complications were discussed with the                            patient. Return to normal activities tomorrow.                            Written discharge instructions were provided to the                            patient.                           - High fiber diet.                           - Continue present medications.                           - Await pathology results. Ladene Artist, MD 08/29/2018 11:14:55 AM This report has been signed electronically.

## 2018-08-29 NOTE — Patient Instructions (Signed)
Handout on polyps, hemorrhoids, diverticulosis, and high fiber diet.   YOU HAD AN ENDOSCOPIC PROCEDURE TODAY AT THE [facility]:   Refer to the procedure report that was given to you for any specific questions about what was found during the examination.  If the procedure report does not answer your questions, please call your gastroenterologist to clarify.  If you requested that your care partner not be given the details of your procedure findings, then the procedure report has been included in a sealed envelope for you to review at your convenience later.  YOU SHOULD EXPECT: Some feelings of bloating in the abdomen. Passage of more gas than usual.  Walking can help get rid of the air that was put into your GI tract during the procedure and reduce the bloating. If you had a lower endoscopy (such as a colonoscopy or flexible sigmoidoscopy) you may notice spotting of blood in your stool or on the toilet paper. If you underwent a bowel prep for your procedure, you may not have a normal bowel movement for a few days.  Please Note:  You might notice some irritation and congestion in your nose or some drainage.  This is from the oxygen used during your procedure.  There is no need for concern and it should clear up in a day or so.  SYMPTOMS TO REPORT IMMEDIATELY:   Following lower endoscopy (colonoscopy or flexible sigmoidoscopy):  Excessive amounts of blood in the stool  Significant tenderness or worsening of abdominal pains  Swelling of the abdomen that is new, acute  Fever of 100F or higher  For urgent or emergent issues, a gastroenterologist can be reached at any hour by calling [phone].   DIET:  We do recommend a small meal at first, but then you may proceed to your regular diet.  Drink plenty of fluids but you should avoid alcoholic beverages for 24 hours.  ACTIVITY:  You should plan to take it easy for the rest of today and you should NOT DRIVE or use heavy machinery  until tomorrow (because of the sedation medicines used during the test).    FOLLOW UP: Our staff will call the number listed on your records the next business day following your procedure to check on you and address any questions or concerns that you may have regarding the information given to you following your procedure. If we do not reach you, we will leave a message.  However, if you are feeling well and you are not experiencing any problems, there is no need to return our call.  We will assume that you have returned to your regular daily activities without incident.  If any biopsies were taken you will be contacted by phone or by letter within the next 1-3 weeks.  Please call us at [phone] if you have not heard about the biopsies in 3 weeks.    SIGNATURES/CONFIDENTIALITY: You and/or your care partner have signed paperwork which will be entered into your electronic medical record.  These signatures attest to the fact that that the information above on your After Visit Summary has been reviewed and is understood.  Full responsibility of the confidentiality of this discharge information lies with you and/or your care-partner. 

## 2018-08-29 NOTE — Progress Notes (Signed)
Called to room to assist during endoscopic procedure.  Patient ID and intended procedure confirmed with present staff. Received instructions for my participation in the procedure from the performing physician.  

## 2018-08-30 ENCOUNTER — Telehealth: Payer: Self-pay

## 2018-08-30 NOTE — Telephone Encounter (Signed)
  Follow up Call-  Call back number 08/29/2018  Post procedure Call Back phone  # [phone]  Permission to leave phone message Yes  Some recent data might be hidden     Patient questions:  Do you have a fever, pain , or abdominal swelling? No. Pain Score  0 *  Have you tolerated food without any problems? Yes.    Have you been able to return to your normal activities? Yes.    Do you have any questions about your discharge instructions: Diet   No. Medications  No. Follow up visit  No.  Do you have questions or concerns about your Care? No.  Actions: * If pain score is 4 or above: No action needed, pain <4.

## 2018-09-06 ENCOUNTER — Encounter: Payer: Self-pay | Admitting: Gastroenterology

## 2019-03-25 ENCOUNTER — Ambulatory Visit: Payer: Medicare HMO

## 2019-03-30 DIAGNOSIS — R69 Illness, unspecified: Secondary | ICD-10-CM | POA: Diagnosis not present

## 2019-07-26 HISTORY — PX: CATARACT EXTRACTION, BILATERAL: SHX1313

## 2020-01-10 IMAGING — DX DG CHEST 2V
2 series · 2 of 2 positions shown · non-contrast
Comparison: Radiographs June 19, 2015.

CLINICAL DATA: Cough.

EXAM:
CHEST - 2 VIEW

[chest pa]
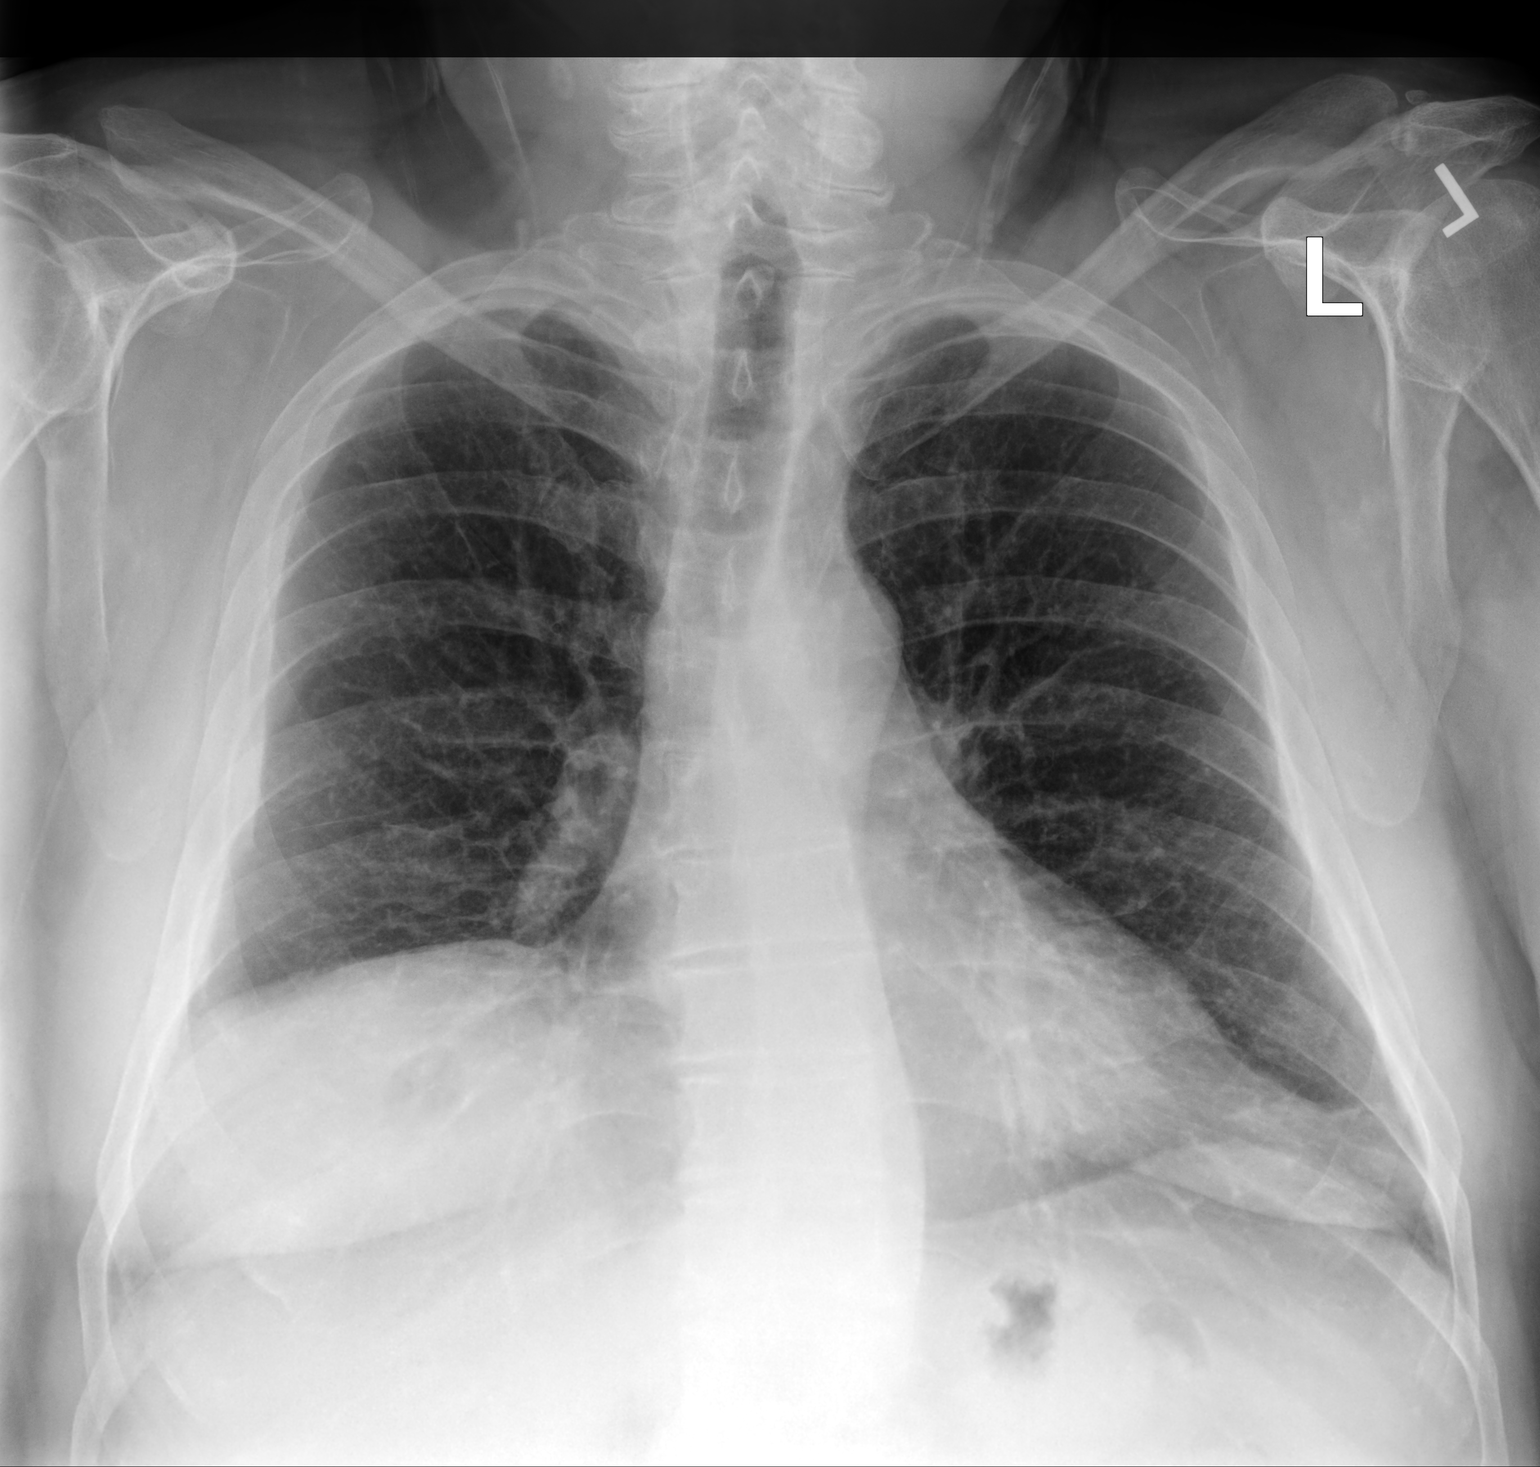

[chest lat]
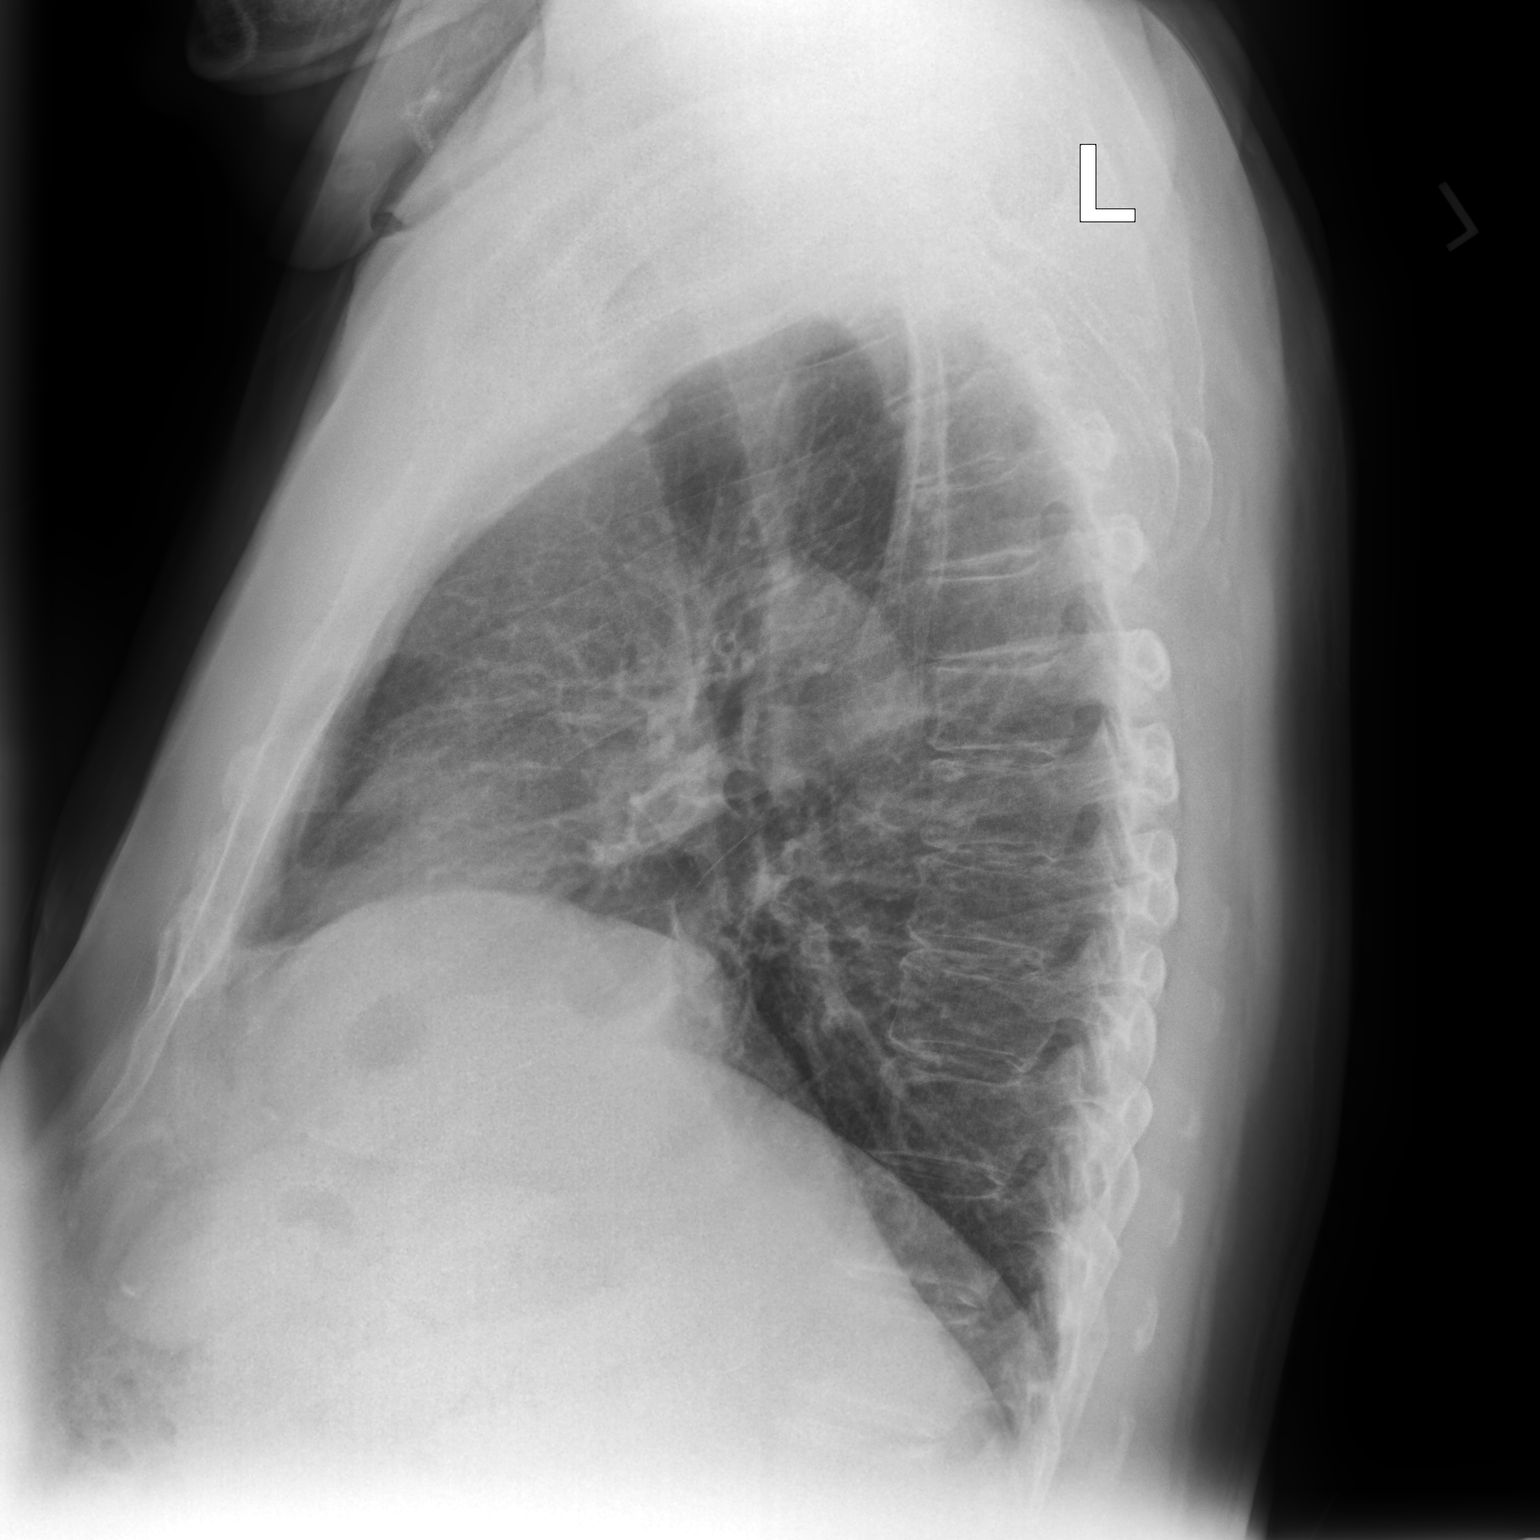

[2 of 2 positions shown; findings below may reference images not displayed]

FINDINGS: The heart size and mediastinal contours are within normal limits.
Both lungs are clear. Stable elevated right hemidiaphragm is noted.
The visualized skeletal structures are unremarkable.
IMPRESSION: No active cardiopulmonary disease.

## 2020-03-17 LAB — BASIC METABOLIC PANEL
BUN: 17 (ref 4–21)
CO2: 29 — AB (ref 13–22)
Chloride: 105 (ref 99–108)
Creatinine: 1.4 — AB (ref <=1.3)
Glucose: 126
Potassium: 3.9 (ref 3.4–5.3)
Sodium: 137 (ref 137–147)

## 2020-03-17 LAB — LIPID PANEL
Cholesterol: 130 (ref 0–200)
HDL: 37 (ref 35–70)
LDL Cholesterol: 63
Triglycerides: 151 (ref 40–160)

## 2020-03-17 LAB — COMPREHENSIVE METABOLIC PANEL
Albumin: 3.8 (ref 3.5–5.0)
Calcium: 8.6 — AB (ref 8.7–10.7)

## 2020-03-17 LAB — CBC AND DIFFERENTIAL
HCT: 45 (ref 41–53)
Hemoglobin: 15.3 (ref 13.5–17.5)
Platelets: 281 (ref 150–399)
WBC: 8.8

## 2020-03-17 LAB — VITAMIN B12: Vitamin B-12: 900

## 2020-03-17 LAB — CBC: RBC: 5.01 (ref 3.87–5.11)

## 2020-03-17 LAB — HEMOGLOBIN A1C: Hemoglobin A1C: 6.1

## 2020-04-15 ENCOUNTER — Ambulatory Visit (INDEPENDENT_AMBULATORY_CARE_PROVIDER_SITE_OTHER): Payer: Medicare HMO | Admitting: Family Medicine

## 2020-04-15 ENCOUNTER — Encounter: Payer: Self-pay | Admitting: Family Medicine

## 2020-04-15 ENCOUNTER — Other Ambulatory Visit: Payer: Self-pay

## 2020-04-15 VITALS — BP 122/72 | HR 69 | Temp 98.1°F | Ht 72.0 in | Wt 276.4 lb

## 2020-04-15 DIAGNOSIS — I10 Essential (primary) hypertension: Secondary | ICD-10-CM

## 2020-04-15 DIAGNOSIS — Z23 Encounter for immunization: Secondary | ICD-10-CM | POA: Diagnosis not present

## 2020-04-15 DIAGNOSIS — G2581 Restless legs syndrome: Secondary | ICD-10-CM

## 2020-04-15 DIAGNOSIS — R600 Localized edema: Secondary | ICD-10-CM

## 2020-04-15 DIAGNOSIS — K219 Gastro-esophageal reflux disease without esophagitis: Secondary | ICD-10-CM

## 2020-04-15 DIAGNOSIS — E785 Hyperlipidemia, unspecified: Secondary | ICD-10-CM

## 2020-04-15 NOTE — Patient Instructions (Addendum)
Please let me know dates of immunizations: Tetanus, pneumonia, shingles  Let me know about heart and bladder evaluation results  Ok to moisturize nose with petroleum during day to help with dryness

## 2020-04-15 NOTE — Progress Notes (Signed)
[name] [name] DOB: 11/11/1945 Encounter date: 04/15/2020  This is a 74 y.o. male who presents to establish care.  History of present illness: Single visit with [facility] in 11/2017 to establish care. [facility].  Had bladder imaging done this week and heart eval 2 weeks ago and he will bring results when he get these.   Doesn't recall why he had heart imaging. Bladder eval done because he is urinating a lot, getting swelling in legs. Is taking lasix.  Does see specialists at [facility] when primary requests. None that he is seeing regularly.   Last colonoscopy was with Dr. Fuller Plan, but through [place].   Occasional lower back discomfort, but nothing regularly.   GERD: prilosec increased by a doc due to his coughing. Was coughed and told acid reflux, no hiatal hernia. Coughs up clear film mucous which happens regularly. Not always good about using nasal spray.   HTN: doesn't check pressures at home. Good about taking medication every day.  OSA: uses cpap nightly. Very good about this.   RLS: gets cramps a lot. Ropinirole doesn't seem to help with cramps much. He is good about drinking water. Not every night. bloodwork last month was good. Can sometimes feel pull, but not always into big cramp. Walking helps. Sometimes catches with stretching.   He states that he has had pneumonia shots and shingles.   Past Medical History:  Diagnosis Date  . Arthritis   . Back pain   . GERD (gastroesophageal reflux disease)   . Hypercholesteremia   . Hypertension   . Neuropathy    in hands  . Sleep apnea    Past Surgical History:  Procedure Laterality Date  . CATARACT EXTRACTION, BILATERAL  2021   05/09/2019, 08/19/19  . COLONOSCOPY    . DENTAL SURGERY     had implants  . HAND SURGERY  2008   left hand/ crushed bones  . MIDDLE EAR SURGERY     put in metal pin in right ear/Dr Earl Gala  . SHOULDER ARTHROSCOPY Right 2009   2009,2020   No Known Allergies Current Meds   Medication Sig  . Acetaminophen (TYLENOL ARTHRITIS PAIN PO) Take by mouth as needed.   Marland Kitchen atenolol (TENORMIN) 25 MG tablet Take 25 mg by mouth 2 (two) times daily.  . Capsaicin 0.1 % CREA Apply topically.  . Carboxymethylcellulose Sod PF 0.5 % SOLN Apply 1 drop to eye in the morning, at noon, in the evening, and at bedtime.  . cholecalciferol (VITAMIN D) 1000 UNITS tablet Take 2,000 Units by mouth daily.   . diclofenac sodium (VOLTAREN) 1 % GEL Apply topically 4 (four) times daily.   . fluticasone (FLONASE) 50 MCG/ACT nasal spray Place 2 sprays into both nostrils daily.  . furosemide (LASIX) 20 MG tablet Take 20 mg by mouth as needed.  . loratadine (CLARITIN) 10 MG tablet Take 10 mg by mouth daily.  . methocarbamol (ROBAXIN) 750 MG tablet Take 750 mg by mouth at bedtime.  Marland Kitchen omeprazole (PRILOSEC) 20 MG capsule Take 20 mg by mouth 2 (two) times daily before a meal.   . rOPINIRole (REQUIP) 0.5 MG tablet Take 0.5 mg by mouth at bedtime.  . simvastatin (ZOCOR) 20 MG tablet Take 20 mg by mouth daily at 6 PM.  . tamsulosin (FLOMAX) 0.4 MG CAPS capsule Take 0.4 mg by mouth at bedtime.   Social History   Tobacco Use  . Smoking status: Former Smoker    Packs/day: 0.50  Years: 50.00    Pack years: 25.00    Types: Cigarettes, E-cigarettes  . Smokeless tobacco: Former Systems developer    Quit date: 05/25/2008  . Tobacco comment: quit 10 to 12 years   Substance Use Topics  . Alcohol use: No   Family History  Problem Relation Age of Onset  . Cirrhosis Mother        no alcohol  . COPD Sister   . Cirrhosis Brother   . Other Brother        uncertain  . Other Brother        stillborn     Review of Systems  Constitutional: Negative for chills, fatigue and fever.  Respiratory: Negative for cough, chest tightness, shortness of breath and wheezing.   Cardiovascular: Positive for leg swelling. Negative for chest pain and palpitations.  Genitourinary:       Feels like he urinates well; just goes  frequently through day. He is awaiting results of his urinary study he just completed.    Objective:  BP 122/72 (BP Location: Left Arm, Patient Position: Sitting, Cuff Size: Large)   Pulse 69   Temp 98.1 F (36.7 C) (Oral)   Ht 6' (1.829 m)   Wt 276 lb 6.4 oz (125.4 kg)   BMI 37.49 kg/m   Weight: 276 lb 6.4 oz (125.4 kg)   BP Readings from Last 3 Encounters:  04/15/20 122/72  08/29/18 (!) 103/58  08/07/18 130/84   Wt Readings from Last 3 Encounters:  04/15/20 276 lb 6.4 oz (125.4 kg)  08/29/18 262 lb (118.8 kg)  08/16/18 263 lb 3.2 oz (119.4 kg)    Physical Exam Constitutional:      General: He is not in acute distress.    Appearance: He is well-developed.  Cardiovascular:     Rate and Rhythm: Normal rate and regular rhythm.     Heart sounds: Normal heart sounds. No murmur heard.  No friction rub.  Pulmonary:     Effort: Pulmonary effort is normal. No respiratory distress.     Breath sounds: Normal breath sounds. No wheezing or rales.  Musculoskeletal:     Right lower leg: 2+ Edema present.     Left lower leg: 2+ Edema present.  Neurological:     Mental Status: He is alert and oriented to person, place, and time.  Psychiatric:        Behavior: Behavior normal.     Assessment/Plan: 1. Essential hypertension Blood pressure well controlled.  He does follow-up regularly at the [place].  He is currently awaiting results of cardiovascular studies that he had completed recently.  He will get these results to me once he has them.  2. Gastroesophageal reflux disease, unspecified whether esophagitis present Reflux has been well controlled on the omeprazole twice daily.  3. Hyperlipidemia, unspecified hyperlipidemia type Doing well with simvastatin 20 mg daily.  He did bring in recent blood work today which I will scanned into the system.  4. RLS (restless legs syndrome) He has ongoing issues with restless leg syndrome.  He is currently taking Requip.  He does get regular  muscle cramps, but is very good about hydrating well.  He has recent normal electrolytes and thyroid studies as well as kidney and liver function studies.  5. Need for immunization against influenza - Flu Vaccine QUAD High Dose(Fluad)  6. Bilateral lower extremity edema He is currently being evaluated at the [facility] for lower extremity edema.  He will let me know results of studies once he  gets these.  Patient instructions: Please let me know dates of immunizations: Tetanus, pneumonia, shingles  Let me know about heart and bladder evaluation results  Ok to moisturize nose with petroleum during day to help with dryness    Return in about 3 months (around 07/15/2020) for physical exam.  Micheline Rough, MD  Time with patient, chart review records that patient brought to the office, charting time: 30 minutes.

## 2020-04-20 ENCOUNTER — Encounter: Payer: Self-pay | Admitting: Family Medicine

## 2021-06-01 ENCOUNTER — Telehealth: Payer: Self-pay

## 2021-06-01 NOTE — Telephone Encounter (Signed)
Last OV 04/15/20.  LVM instructions for pt to call to schedule appt with PCP.

## 2021-06-14 ENCOUNTER — Telehealth: Payer: Self-pay

## 2021-06-14 NOTE — Telephone Encounter (Signed)
Wife of patient called stating pt is caring for his parents and can't leave them along to come into the office so a virtual appt was made

## 2021-06-15 NOTE — Telephone Encounter (Signed)
Noted  

## 2021-06-30 ENCOUNTER — Encounter: Payer: Self-pay | Admitting: Family Medicine

## 2021-06-30 ENCOUNTER — Telehealth (INDEPENDENT_AMBULATORY_CARE_PROVIDER_SITE_OTHER): Payer: Medicare HMO | Admitting: Family Medicine

## 2021-06-30 ENCOUNTER — Other Ambulatory Visit: Payer: Self-pay

## 2021-06-30 VITALS — Wt 261.0 lb

## 2021-06-30 DIAGNOSIS — G4733 Obstructive sleep apnea (adult) (pediatric): Secondary | ICD-10-CM | POA: Diagnosis not present

## 2021-06-30 DIAGNOSIS — I10 Essential (primary) hypertension: Secondary | ICD-10-CM

## 2021-06-30 DIAGNOSIS — E785 Hyperlipidemia, unspecified: Secondary | ICD-10-CM

## 2021-06-30 DIAGNOSIS — R252 Cramp and spasm: Secondary | ICD-10-CM | POA: Diagnosis not present

## 2021-06-30 DIAGNOSIS — K219 Gastro-esophageal reflux disease without esophagitis: Secondary | ICD-10-CM

## 2021-06-30 DIAGNOSIS — G2581 Restless legs syndrome: Secondary | ICD-10-CM

## 2021-06-30 NOTE — Progress Notes (Signed)
Virtual Visit via Telephone Note  I connected with [name] [name]  on 06/30/21 at  1:00 PM EST by telephone and verified that I am speaking with the correct person using two identifiers.   I discussed the limitations, risks, security and privacy concerns of performing an evaluation and management service by telephone and the availability of in person appointments. I also discussed with the patient that there may be a patient responsible charge related to this service. The patient expressed understanding and agreed to proceed.  Location patient: home Location provider:  [facility]  [place], [place] [address]  Participants present for the call: patient, provider Patient did not have a visit in the prior 7 days to address this/these issue(s).   History of Present Illness: No specific concerns today. Last visit was a year ago.   Fluid pills work for swelling in legs. Has a busted vein and states he had some work up for this, but nothing more to do for this. That occurred early October. This was evaluated through [place]. He did have bloodwork done through [place] this year and states that he will work on getting a copy. Has been done in last six months.   States that bladder evaluation went well; heart evaluation went well. Feels that urinary frequency is better on the lasix. Was previously on hctz and seems to do better off of this.   States that bp runs a little high. HR 70, oxygen 97%. Bp cuff not working. Taking atenolol regularly.  OSA: uses regularly. Doesn't go without it.   GERD: cough as symptom but controlled with prilosec. Sometimes will get heartburn and will take 2. Pretty infrequent.   HL: on simvastatin 20mg  daily. Hard to tell if side effects with the muscle cramps and arthritis that he has.   RLS: controlled with requip. Does still get some cramping lower extremities. Has tried theraworks topical treatment. Gets cramps around thigh, feet, really  everywhere. Easily cramps in certain positions.   Doesn't recall reason for starting flomax, but takes regularly. Usually wakes 1-2 times/night.     Observations/Objective: Patient sounds cheerful and well on the phone. I do not appreciate any SOB. Speech and thought processing are grossly intact. Patient reported vitals: weight 261lb (has been working on losing). 148/76. Pulse 69. Hasn't taken atenolol yet today. He has a hard time with remembering to take the morning dose (was written BID).   Assessment and Plan:  1. Hyperlipidemia, unspecified hyperlipidemia type Continue with simvastatin. He has had bloodwork and will get these results for me.   2. Essential hypertension Has had a hard time with remembering atenolol in the morning. Suggested taking just daily rather than BID but taking 2 tablets (50mg  total) in the evening.   3. Gastroesophageal reflux disease, unspecified whether esophagitis present Continue with prilosec.   4. RLS (restless legs syndrome) May need some additional bloodwork. He will drop off prior bloodwork and I would consider some additional lab eval  like magnesium, b12/folate etc to further evaluate.  5. Leg cramps See above.   6. Obstructive sleep apnea Continue with cpap   Follow Up Instructions:  Pending lab review.   [id number] 5-10 [id number] 11-20 9443 21-30 I did not refer this patient for an OV in the next 24 hours for this/these issue(s).  I discussed the assessment and treatment plan with the patient. The patient was provided an opportunity to ask questions and all were answered. The patient agreed with the  plan and demonstrated an understanding of the instructions.   The patient was advised to call back or seek an in-person evaluation if the symptoms worsen or if the condition fails to improve as anticipated.  I provided 25 minutes of non-face-to-face time during this encounter.   Micheline Rough, MD

## 2021-09-29 ENCOUNTER — Encounter: Payer: Self-pay | Admitting: Gastroenterology

## 2022-11-05 ENCOUNTER — Inpatient Hospital Stay (HOSPITAL_COMMUNITY)
Admission: EM | Admit: 2022-11-05 | Discharge: 2022-11-08 | DRG: 243 | Disposition: A | Discharge status: Home / Self Care | Payer: No Typology Code available for payment source | Source: Other Acute Inpatient Hospital | Attending: Internal Medicine | Admitting: Internal Medicine

## 2022-11-05 ENCOUNTER — Encounter (HOSPITAL_COMMUNITY): Payer: Self-pay

## 2022-11-05 DIAGNOSIS — I442 Atrioventricular block, complete: Principal | ICD-10-CM | POA: Diagnosis present

## 2022-11-05 DIAGNOSIS — G4733 Obstructive sleep apnea (adult) (pediatric): Secondary | ICD-10-CM | POA: Diagnosis present

## 2022-11-05 DIAGNOSIS — G2581 Restless legs syndrome: Secondary | ICD-10-CM | POA: Diagnosis present

## 2022-11-05 DIAGNOSIS — Z87891 Personal history of nicotine dependence: Secondary | ICD-10-CM

## 2022-11-05 DIAGNOSIS — N179 Acute kidney failure, unspecified: Secondary | ICD-10-CM | POA: Diagnosis not present

## 2022-11-05 DIAGNOSIS — Z79899 Other long term (current) drug therapy: Secondary | ICD-10-CM

## 2022-11-05 DIAGNOSIS — Z9842 Cataract extraction status, left eye: Secondary | ICD-10-CM

## 2022-11-05 DIAGNOSIS — Z825 Family history of asthma and other chronic lower respiratory diseases: Secondary | ICD-10-CM

## 2022-11-05 DIAGNOSIS — R001 Bradycardia, unspecified: Principal | ICD-10-CM

## 2022-11-05 DIAGNOSIS — G629 Polyneuropathy, unspecified: Secondary | ICD-10-CM | POA: Diagnosis present

## 2022-11-05 DIAGNOSIS — Z9841 Cataract extraction status, right eye: Secondary | ICD-10-CM

## 2022-11-05 DIAGNOSIS — E7800 Pure hypercholesterolemia, unspecified: Secondary | ICD-10-CM | POA: Diagnosis present

## 2022-11-05 DIAGNOSIS — E785 Hyperlipidemia, unspecified: Secondary | ICD-10-CM | POA: Diagnosis present

## 2022-11-05 DIAGNOSIS — K219 Gastro-esophageal reflux disease without esophagitis: Secondary | ICD-10-CM | POA: Diagnosis present

## 2022-11-05 DIAGNOSIS — I10 Essential (primary) hypertension: Secondary | ICD-10-CM | POA: Diagnosis present

## 2022-11-05 NOTE — Discharge Instructions (Addendum)
     Supplemental Discharge Instructions for  Pacemaker/Defibrillator Patients    Activity No heavy lifting or vigorous activity with your left/right arm for 6 to 8 weeks.  Do not raise your left/right arm above your head for one week.  Gradually raise your affected arm as drawn below.              11/12/22                   11/13/22                    11/14/22                  11/15/22 __   NO DRIVING for  1 week  ; you may begin driving on   9/44/96  .  WOUND CARE Keep the wound area clean and dry.  Do not get this area wet , no showers for one week; you may shower on  11/15/22   . The tape/steri-strips on your wound will fall off; do not pull them off.  No bandage is needed on the site.  DO  NOT apply any creams, oils, or ointments to the wound area. If you notice any drainage or discharge from the wound, any swelling or bruising at the site, or you develop a fever > 101? F after you are discharged home, call the office at once.  Special Instructions You are still able to use cellular telephones; use the ear opposite the side where you have your pacemaker/defibrillator.  Avoid carrying your cellular phone near your device. When traveling through airports, show security personnel your identification card to avoid being screened in the metal detectors.  Ask the security personnel to use the hand wand. Avoid arc welding equipment, MRI testing (magnetic resonance imaging), TENS units (transcutaneous nerve stimulators).  Call the office for questions about other devices. Avoid electrical appliances that are in poor condition or are not properly grounded. Microwave ovens are safe to be near or to operate.

## 2022-11-05 NOTE — ED Triage Notes (Signed)
Pt comes from [facility] for 2nd degree heart block, pt went today for SOB, denies CP, some dizziness.

## 2022-11-05 NOTE — ED Notes (Signed)
Cardiology at bedside.

## 2022-11-05 NOTE — ED Provider Notes (Signed)
[place] EMERGENCY DEPARTMENT AT [facility] Provider Note   CSN: [id number] Arrival date & time: 11/05/22  2233     History Chief Complaint  Patient presents with   Heart Block    HPI [name] [name] is a 77 y.o. male presenting for chief complaint of near syncope.  He was seen at [name] earlier tonight for the symptoms found to be in complete heart block transferred, accepted in transfer, for new diagnosis and further evaluation and management.  He states that he has had 1 week of near syncope every time he stands up has been trying to tough it out attributed to a new losartan administration but was continuing to worsen despite holding this medicine over the last 48 hours. Patient's recorded medical, surgical, social, medication list and allergies were reviewed in the Snapshot window as part of the initial history.   Review of Systems   Review of Systems  Constitutional:  Negative for chills and fever.  HENT:  Negative for ear pain and sore throat.   Eyes:  Negative for pain and visual disturbance.  Respiratory:  Negative for cough and shortness of breath.   Cardiovascular:  Negative for chest pain and palpitations.  Gastrointestinal:  Negative for abdominal pain and vomiting.  Genitourinary:  Negative for dysuria and hematuria.  Musculoskeletal:  Negative for arthralgias and back pain.  Skin:  Negative for color change and rash.  Neurological:  Positive for light-headedness. Negative for seizures and syncope.  All other systems reviewed and are negative.   Physical Exam Updated Vital Signs BP (!) 129/54   Pulse (!) 31   Temp 97.8 F (36.6 C) (Oral)   Resp 13   SpO2 98%  Physical Exam Vitals and nursing note reviewed.  Constitutional:      General: He is not in acute distress.    Appearance: He is well-developed.  HENT:     Head: Normocephalic and atraumatic.  Eyes:     Conjunctiva/sclera: Conjunctivae normal.  Cardiovascular:     Rate and  Rhythm: Normal rate and regular rhythm.     Heart sounds: No murmur heard. Pulmonary:     Effort: Pulmonary effort is normal. No respiratory distress.     Breath sounds: Normal breath sounds.  Abdominal:     Palpations: Abdomen is soft.     Tenderness: There is no abdominal tenderness.  Musculoskeletal:        General: No swelling.     Cervical back: Neck supple.  Skin:    General: Skin is warm and dry.     Capillary Refill: Capillary refill takes less than 2 seconds.  Neurological:     Mental Status: He is alert. He is disoriented.  Psychiatric:        Mood and Affect: Mood normal.      ED Course/ Medical Decision Making/ A&P Clinical Course as of 11/05/22 2325  Sat Nov 05, 2022  2303 Evaluated personally at bedside and received handoff call from [facility]  77 year old male presenting with a chief complaint of new onset bradycardia palpitations found to have complete heart block at [facility] transferred for cardiology care.  Is on atenolol and losartan.  Blood pressure normal on arrival heart rate remains suppressed appears to be heart block on EKG.  Cardiology consultation disposition likely inpatient pending results. [CC]  2313 Personally consulted cardiology regarding this patient's care, they stated that the patient would likely require admission for further diagnostic evaluation and management.  They are  coming to evaluate at bedside will place admission orders when complete. [CC]    Clinical Course User Index [CC] Glyn Ade, MD    Procedures .Critical Care  Performed by: Glyn Ade, MD Authorized by: Glyn Ade, MD   Critical care provider statement:    Critical care time (minutes):  30   Critical care was necessary to treat or prevent imminent or life-threatening deterioration of the following conditions:  Cardiac failure   Critical care was time spent personally by me on the following activities:  Development of treatment plan with patient  or surrogate, discussions with consultants, evaluation of patient's response to treatment, examination of patient, ordering and review of laboratory studies, ordering and review of radiographic studies, ordering and performing treatments and interventions, pulse oximetry, re-evaluation of patient's condition and review of old charts   Care discussed with: admitting provider      Medications Ordered in ED Medications - No data to display  Medical Decision Making:    [name] is a 77 y.o. male who presented to the ED today with symptomatic bradycardia found to be complete heart block detailed above.    Patient fortunately well-appearing at bedside, pads are in place though he is not requiring any acute pacing at this time.  Will hold further beta-blockade, admit to cardiology for further care and management. Will consider for procedural intervention.  Consulted cardiology who is planning to evaluate at bedside and admit for further care and management.  Disposition:   Based on the above findings, I believe this patient is stable for admission.    Patient/family educated about specific findings on our evaluation and explained exact reasons for admission.  Patient/family educated about clinical situation and time was allowed to answer questions.   Admission team communicated with and agreed with need for admission. Patient admitted. Patient  ready to move at this time.     Emergency Department Medication Summary:   Medications - No data to display    Clinical Impression:  1. Bradycardia   2. Complete heart block      Admit   Final Clinical Impression(s) / ED Diagnoses Final diagnoses:  Bradycardia  Complete heart block    Rx / DC Orders ED Discharge Orders     None         Glyn Ade, MD 11/05/22 2325

## 2022-11-06 ENCOUNTER — Inpatient Hospital Stay (HOSPITAL_COMMUNITY): Payer: No Typology Code available for payment source

## 2022-11-06 DIAGNOSIS — R001 Bradycardia, unspecified: Secondary | ICD-10-CM | POA: Diagnosis present

## 2022-11-06 DIAGNOSIS — G4733 Obstructive sleep apnea (adult) (pediatric): Secondary | ICD-10-CM | POA: Diagnosis present

## 2022-11-06 DIAGNOSIS — I442 Atrioventricular block, complete: Secondary | ICD-10-CM | POA: Diagnosis present

## 2022-11-06 DIAGNOSIS — R9431 Abnormal electrocardiogram [ECG] [EKG]: Secondary | ICD-10-CM

## 2022-11-06 DIAGNOSIS — Z87891 Personal history of nicotine dependence: Secondary | ICD-10-CM | POA: Diagnosis not present

## 2022-11-06 DIAGNOSIS — Z9842 Cataract extraction status, left eye: Secondary | ICD-10-CM | POA: Diagnosis not present

## 2022-11-06 DIAGNOSIS — G629 Polyneuropathy, unspecified: Secondary | ICD-10-CM | POA: Diagnosis present

## 2022-11-06 DIAGNOSIS — Z79899 Other long term (current) drug therapy: Secondary | ICD-10-CM | POA: Diagnosis not present

## 2022-11-06 DIAGNOSIS — Z9841 Cataract extraction status, right eye: Secondary | ICD-10-CM | POA: Diagnosis not present

## 2022-11-06 DIAGNOSIS — N179 Acute kidney failure, unspecified: Secondary | ICD-10-CM | POA: Diagnosis not present

## 2022-11-06 DIAGNOSIS — I10 Essential (primary) hypertension: Secondary | ICD-10-CM | POA: Diagnosis present

## 2022-11-06 DIAGNOSIS — E7800 Pure hypercholesterolemia, unspecified: Secondary | ICD-10-CM | POA: Diagnosis present

## 2022-11-06 DIAGNOSIS — K219 Gastro-esophageal reflux disease without esophagitis: Secondary | ICD-10-CM | POA: Diagnosis present

## 2022-11-06 DIAGNOSIS — Z825 Family history of asthma and other chronic lower respiratory diseases: Secondary | ICD-10-CM | POA: Diagnosis not present

## 2022-11-06 LAB — BASIC METABOLIC PANEL
Anion gap: 12 (ref 5–15)
BUN: 21 mg/dL (ref 8–23)
CO2: 21 mmol/L — ABNORMAL LOW (ref 22–32)
Calcium: 8.9 mg/dL (ref 8.9–10.3)
Chloride: 108 mmol/L (ref 98–111)
Creatinine, Ser: 1.68 mg/dL — ABNORMAL HIGH (ref 0.61–1.24)
GFR, Estimated: 42 mL/min — ABNORMAL LOW (ref >60)
Glucose, Bld: 101 mg/dL — ABNORMAL HIGH (ref 70–99)
Potassium: 4.1 mmol/L (ref 3.5–5.1)
Sodium: 141 mmol/L (ref 135–145)

## 2022-11-06 LAB — TSH: TSH: 4.446 u[IU]/mL (ref 0.350–4.500)

## 2022-11-06 LAB — ECHOCARDIOGRAM COMPLETE
AV Mean grad: 3 mmHg
AV Peak grad: 6.7 mmHg
Ao pk vel: 1.29 m/s
Area-P 1/2: 2.67 cm2
Weight: 3079.39 oz

## 2022-11-06 LAB — MAGNESIUM: Magnesium: 2 mg/dL (ref 1.7–2.4)

## 2022-11-06 LAB — CBC
HCT: 47.6 % (ref 39.0–52.0)
Hemoglobin: 15.2 g/dL (ref 13.0–17.0)
MCH: 30.4 pg (ref 26.0–34.0)
MCHC: 31.9 g/dL (ref 30.0–36.0)
MCV: 95.2 fL (ref 80.0–100.0)
Platelets: 246 10*3/uL (ref 150–400)
RBC: 5 MIL/uL (ref 4.22–5.81)
RDW: 12.4 % (ref 11.5–15.5)
WBC: 11.9 10*3/uL — ABNORMAL HIGH (ref 4.0–10.5)
nRBC: 0 % (ref 0.0–0.2)

## 2022-11-06 LAB — LACTIC ACID, PLASMA: Lactic Acid, Venous: 1.8 mmol/L (ref 0.5–1.9)

## 2022-11-06 LAB — GLUCOSE, CAPILLARY: Glucose-Capillary: 86 mg/dL (ref 70–99)

## 2022-11-06 LAB — PROTIME-INR
INR: 1.2 (ref 0.8–1.2)
Prothrombin Time: 14.9 seconds (ref 11.4–15.2)

## 2022-11-06 LAB — MRSA NEXT GEN BY PCR, NASAL: MRSA by PCR Next Gen: NOT DETECTED

## 2022-11-06 MED ORDER — CHLORHEXIDINE GLUCONATE CLOTH 2 % EX PADS
6.0000 | MEDICATED_PAD | Freq: Every day | CUTANEOUS | Status: DC
  Administered 2022-11-06 – 2022-11-07 (×2): 6 via TOPICAL

## 2022-11-06 MED ORDER — DOPAMINE-DEXTROSE 3.2-5 MG/ML-% IV SOLN
2.5000 ug/kg/min | INTRAVENOUS | Status: DC
  Administered 2022-11-06: 2.5 ug/kg/min via INTRAVENOUS
  Filled 2022-11-06: qty 250

## 2022-11-06 MED ORDER — ROPINIROLE HCL 0.5 MG PO TABS
0.5000 mg | ORAL_TABLET | Freq: Every day | ORAL | Status: DC
  Administered 2022-11-06 – 2022-11-07 (×3): 0.5 mg via ORAL
  Filled 2022-11-06 (×3): qty 1

## 2022-11-06 MED ORDER — SIMVASTATIN 20 MG PO TABS
20.0000 mg | ORAL_TABLET | Freq: Every day | ORAL | Status: DC
  Administered 2022-11-06 – 2022-11-07 (×2): 20 mg via ORAL
  Filled 2022-11-06 (×2): qty 1

## 2022-11-06 MED ORDER — ACETAMINOPHEN 325 MG PO TABS
650.0000 mg | ORAL_TABLET | ORAL | Status: DC | PRN
  Administered 2022-11-06 – 2022-11-07 (×3): 650 mg via ORAL
  Filled 2022-11-06 (×3): qty 2

## 2022-11-06 MED ORDER — FLUTICASONE PROPIONATE 50 MCG/ACT NA SUSP
2.0000 | Freq: Every day | NASAL | Status: DC
  Administered 2022-11-06 – 2022-11-08 (×2): 2 via NASAL
  Filled 2022-11-06: qty 16

## 2022-11-06 MED ORDER — ONDANSETRON HCL 4 MG/2ML IJ SOLN
4.0000 mg | Freq: Four times a day (QID) | INTRAMUSCULAR | Status: DC | PRN
  Administered 2022-11-06 – 2022-11-07 (×4): 4 mg via INTRAVENOUS
  Filled 2022-11-06 (×4): qty 2

## 2022-11-06 MED ORDER — TAMSULOSIN HCL 0.4 MG PO CAPS
0.4000 mg | ORAL_CAPSULE | Freq: Every day | ORAL | Status: DC
  Administered 2022-11-06 – 2022-11-07 (×3): 0.4 mg via ORAL
  Filled 2022-11-06 (×3): qty 1

## 2022-11-06 MED ORDER — VITAMIN D 1000 UNITS PO TABS
2000.0000 [IU] | ORAL_TABLET | Freq: Every day | ORAL | Status: DC
  Administered 2022-11-06 – 2022-11-08 (×3): 2000 [IU] via ORAL
  Filled 2022-11-06 (×3): qty 2

## 2022-11-06 MED ORDER — ORAL CARE MOUTH RINSE: 15.0000 mL | OROMUCOSAL | Status: DC | PRN

## 2022-11-06 MED ORDER — PANTOPRAZOLE SODIUM 40 MG PO TBEC
40.0000 mg | DELAYED_RELEASE_TABLET | Freq: Every day | ORAL | Status: DC
  Administered 2022-11-06 – 2022-11-08 (×3): 40 mg via ORAL
  Filled 2022-11-06 (×3): qty 1

## 2022-11-06 MED ORDER — LORATADINE 10 MG PO TABS
10.0000 mg | ORAL_TABLET | Freq: Every day | ORAL | Status: DC
  Administered 2022-11-06 – 2022-11-08 (×3): 10 mg via ORAL
  Filled 2022-11-06 (×3): qty 1

## 2022-11-06 MED ORDER — HEPARIN SODIUM (PORCINE) 5000 UNIT/ML IJ SOLN
5000.0000 [IU] | Freq: Three times a day (TID) | INTRAMUSCULAR | Status: DC
  Administered 2022-11-06 – 2022-11-07 (×5): 5000 [IU] via SUBCUTANEOUS
  Filled 2022-11-06 (×5): qty 1

## 2022-11-06 MED ORDER — PERFLUTREN LIPID MICROSPHERE
1.0000 mL | INTRAVENOUS | Status: AC | PRN
  Administered 2022-11-06: 2 mL via INTRAVENOUS

## 2022-11-06 NOTE — Consult Note (Signed)
Cardiology Consultation   Patient ID: [name] [name] MRN: [id number]; DOB: Apr 06, 1946  Admit date: 11/05/2022 Date of Consult: 11/06/2022  PCP:  [facility]   [facility] Providers Cardiologist:  None        Patient Profile:   ANTOIN [name] is a 77 y.o. male with a hx of hypertension, sleep apnea, hyperlipidemia who is being seen 11/06/2022 for the evaluation of complete heart block at the request of [name] [name].  History of Present Illness:   [name] [name] is a 77 year old male with a history as above.  He has been having increasing shortness of breath and dizziness.  He was sitting on his couch and stood up and had severe dizziness.  He called EMS and was taken to [facility] was found to be in complete heart block with heart rates in the 30s.  He was then transferred to [facility].  He is currently on atenolol, with his last dose being in the morning of 11/05/2022.  He was started on dopamine.  While lying in bed he feels well without complaint.   Past Medical History:  Diagnosis Date   Arthritis    Back pain    GERD (gastroesophageal reflux disease)    Hypercholesteremia    Hypertension    Neuropathy    in hands   Sleep apnea     Past Surgical History:  Procedure Laterality Date   CATARACT EXTRACTION, BILATERAL  2021   05/09/2019, 08/19/19   COLONOSCOPY     DENTAL SURGERY     had implants   HAND SURGERY  2008   left hand/ crushed bones   MIDDLE EAR SURGERY     put in metal pin in right ear/Dr Prescott Gum   SHOULDER ARTHROSCOPY Right 2009   2009,2020     Home Medications:  Prior to Admission medications   Medication Sig Start Date End Date Taking? Authorizing Provider  atenolol (TENORMIN) 25 MG tablet Take 25 mg by mouth 2 (two) times daily.    [provider]  Carboxymethylcellulose Sod PF 0.5 % SOLN Apply 1 drop to eye as needed.    [provider]  cholecalciferol (VITAMIN D) 1000 UNITS tablet  Take 2,000 Units by mouth daily.     [provider]  diclofenac sodium (VOLTAREN) 1 % GEL Apply topically as needed.    [provider]  fluticasone (FLONASE) 50 MCG/ACT nasal spray Place 2 sprays into both nostrils daily.    [provider]  furosemide (LASIX) 20 MG tablet Take 20 mg by mouth as needed.    [provider]  loratadine (CLARITIN) 10 MG tablet Take 10 mg by mouth daily.    [provider]  methocarbamol (ROBAXIN) 750 MG tablet Take 750 mg by mouth at bedtime.    [provider]  omeprazole (PRILOSEC) 20 MG capsule Take 20 mg by mouth 2 (two) times daily before a meal.     [provider]  rOPINIRole (REQUIP) 0.5 MG tablet Take 0.5 mg by mouth at bedtime.    [provider]  simvastatin (ZOCOR) 20 MG tablet Take 20 mg by mouth daily at 6 PM.    [provider]  tamsulosin (FLOMAX) 0.4 MG CAPS capsule Take 0.4 mg by mouth at bedtime.    [provider]    Inpatient Medications: Scheduled Meds:  Chlorhexidine Gluconate Cloth  6 each Topical Daily   cholecalciferol  2,000 Units Oral Daily   fluticasone  2 spray Each Nare Daily   heparin  5,000 Units Subcutaneous Q8H   loratadine  10 mg Oral Daily   pantoprazole  40 mg Oral Daily   rOPINIRole  0.5 mg Oral QHS   simvastatin  20 mg Oral q1800   tamsulosin  0.4 mg Oral QHS   Continuous Infusions:  DOPamine 2.5 mcg/kg/min (11/06/22 0800)   PRN Meds: acetaminophen, ondansetron (ZOFRAN) IV, mouth rinse  Allergies:   No Known Allergies  Social History:   Social History   Socioeconomic History   Marital status: Married    Spouse name: Not on file   Number of children: Not on file   Years of education: Not on file   Highest education level: Not on file  Occupational History   Not on file  Tobacco Use   Smoking status: Former    Packs/day: 0.50    Years: 50.00    Additional pack years: 0.00    Total pack years: 25.00    Types:  Cigarettes, E-cigarettes   Smokeless tobacco: Former    Quit date: 05/25/2008   Tobacco comments:    quit 10 to 12 years   Substance and Sexual Activity   Alcohol use: No   Drug use: No   Sexual activity: Not on file  Other Topics Concern   Not on file  Social History Narrative   Not on file   Social Determinants of Health   Financial Resource Strain: Not on file  Food Insecurity: Not on file  Transportation Needs: Not on file  Physical Activity: Not on file  Stress: Not on file  Social Connections: Not on file  Intimate Partner Violence: Not on file    Family History:    Family History  Problem Relation Age of Onset   Cirrhosis Mother        no alcohol   COPD Sister    Cirrhosis Brother    Other Brother        uncertain   Other Brother        stillborn     ROS:  Please see the history of present illness.   All other ROS reviewed and negative.     Physical Exam/Data:   Vitals:   11/06/22 0600 11/06/22 0700 11/06/22 0745 11/06/22 0800  BP:  (!) 108/50 (!) 110/40 107/70  Pulse: (!) 27 (!) 28 (!) 29 (!) 28  Resp: (!) Temp:    98.2 F (36.8 C)  TempSrc:    Oral  SpO2: 92% 95% 94% 92%  Weight:        Intake/Output Summary (Last 24 hours) at 11/06/2022 0835 Last data filed at 11/06/2022 0800 Gross per 24 hour  Intake 43.16 ml  Output 250 ml  Net -206.84 ml      11/06/2022   12:57 AM 11/06/2022   12:00 AM 06/30/2021   11:52 AM  Last 3 Weights  Weight (lbs) 192 lb 7.4 oz 273 lb 9.5 oz 261 lb  Weight (kg) 87.3 kg 124.1 kg 118.389 kg     Body mass index is 26.1 kg/m.  General:  Well nourished, well developed, in no acute distress HEENT: normal Neck: no JVD Vascular: No carotid bruits; Distal pulses 2+ bilaterally Cardiac: Bradycardic; no murmur  Lungs:  clear to auscultation bilaterally, no wheezing, rhonchi or rales  Abd: soft, nontender, no hepatomegaly  Ext: no edema Musculoskeletal:  No deformities, BUE and BLE strength normal and  equal Skin: warm and dry  Neuro:  CNs 2-12 intact, no focal abnormalities noted Psych:  Normal affect   EKG:  The EKG was personally reviewed and demonstrates: Complete heart block Telemetry:  Telemetry was personally reviewed and demonstrates: Complete heart block  Relevant CV Studies: TTE pending  Laboratory Data:  High Sensitivity Troponin:  No results for input(s): "TROPONINIHS" in the last 720 hours.   Chemistry Recent Labs  Lab 11/06/22 0132  NA 141  K 4.1  CL 108  CO2 21*  GLUCOSE 101*  BUN 21  CREATININE 1.68*  CALCIUM 8.9  MG 2.0  GFRNONAA 42*  ANIONGAP 12    No results for input(s): "PROT", "ALBUMIN", "AST", "ALT", "ALKPHOS", "BILITOT" in the last 168 hours. Lipids No results for input(s): "CHOL", "TRIG", "HDL", "LABVLDL", "LDLCALC", "CHOLHDL" in the last 168 hours.  Hematology Recent Labs  Lab 11/06/22 0132  WBC 11.9*  RBC 5.00  HGB 15.2  HCT 47.6  MCV 95.2  MCH 30.4  MCHC 31.9  RDW 12.4  PLT 246   Thyroid  Recent Labs  Lab 11/06/22 0132  TSH 4.446    BNPNo results for input(s): "BNP", "PROBNP" in the last 168 hours.  DDimer No results for input(s): "DDIMER" in the last 168 hours.   Radiology/Studies:  No results found.   Assessment and Plan:   Complete heart block: Patient is on atenolol.  Has not had a dose in the last 24 hours.  Dopamine has been started.  Heart rates are in the low 30s.  Lloyd Ayo continue with dopamine.  If conduction does not improve, we Leslee Suire need pacemaker implant.  Jonise Weightman make n.p.o. tonight. Hypertension: Currently well-controlled Obstructive sleep apnea: CPAP compliance encouraged   For questions or updates, please contact [facility] Please consult [email or web address] for contact info under    Signed, Arlin Sass Jorja Loa, MD  11/06/2022 8:35 AM

## 2022-11-06 NOTE — Progress Notes (Signed)
Placed patient on CPAP for the night via auto-mode with oxygen set at 5lpm.  

## 2022-11-06 NOTE — ED Notes (Signed)
ED TO INPATIENT HANDOFF REPORT  ED Nurse Name and Phone #:   Emeterio Reeve [phone]   S Name/Age/Gender [name] [name] 77 y.o. male Room/Bed: RESUSC/RESUSC  Code Status   Code Status: Full Code  Home/SNF/Other Home Patient oriented to: self, place, time, and situation Is this baseline? Yes   Triage Complete: Triage complete  Chief Complaint Complete heart block [I44.2]  Triage Note Pt comes from [facility] for 2nd degree heart block, pt went today for SOB, denies CP, some dizziness.    Allergies No Known Allergies  Level of Care/Admitting Diagnosis ED Disposition     ED Disposition  Admit   Condition  --   Comment  Hospital Area: [facility] [[id number]]  Level of Care: ICU [6]  May admit patient to [facility] or [facility] if equivalent level of care is available:: No  Covid Evaluation: Asymptomatic - no recent exposure (last 10 days) testing not required  Diagnosis: Complete heart block [454098]  Admitting Physician: Hermelinda Dellen [[id number]]  Attending Physician: Dolores Patty [[id number]]  Certification:: I certify this patient will need inpatient services for at least 2 midnights  Estimated Length of Stay: 2          B Medical/Surgery History Past Medical History:  Diagnosis Date   Arthritis    Back pain    GERD (gastroesophageal reflux disease)    Hypercholesteremia    Hypertension    Neuropathy    in hands   Sleep apnea    Past Surgical History:  Procedure Laterality Date   CATARACT EXTRACTION, BILATERAL  2021   05/09/2019, 08/19/19   COLONOSCOPY     DENTAL SURGERY     had implants   HAND SURGERY  2008   left hand/ crushed bones   MIDDLE EAR SURGERY     put in metal pin in right ear/Dr Prescott Gum   SHOULDER ARTHROSCOPY Right 2009   2009,2020     A IV Location/Drains/Wounds Patient Lines/Drains/Airways Status     Active Line/Drains/Airways     Name Placement date Placement time Site Days   Peripheral IV  11/05/22 18 G Anterior;Left;Proximal Forearm 11/05/22  2245  Forearm  1            Intake/Output Last 24 hours No intake or output data in the 24 hours ending 11/06/22 0023  Labs/Imaging No results found for this or any previous visit (from the past 48 hour(s)). No results found.  Pending Labs Unresulted Labs (From admission, onward)     Start     Ordered   11/06/22 0500  Basic metabolic panel  Tomorrow morning,   R        11/06/22 0016   11/06/22 0500  CBC  Tomorrow morning,   R        11/06/22 0016   11/06/22 0500  Protime-INR  Tomorrow morning,   R        11/06/22 0016            Vitals/Pain Today's Vitals   11/05/22 2300 11/05/22 2315 11/05/22 2330 11/06/22 0000  BP: (!) 123/55 (!) 125/56 (!) 136/52   Pulse: (!) 53 (!) 30 66   Resp: (!) 23 15 (!) 22   Temp:      TempSrc:      SpO2: 91% 95% 95%   Weight:    124.1 kg  PainSc:        Isolation Precautions No active isolations  Medications Medications  acetaminophen (  TYLENOL) tablet 650 mg (has no administration in time range)  ondansetron (ZOFRAN) injection 4 mg (has no administration in time range)  heparin injection 5,000 Units (has no administration in time range)  DOPamine (INTROPIN) 800 mg in dextrose 5 % 250 mL (3.2 mg/mL) infusion (has no administration in time range)    Mobility walks     Focused Assessments Cardiac Assessment Handoff:    No results found for: "CKTOTAL", "CKMB", "CKMBINDEX", "TROPONINI" No results found for: "DDIMER" Does the Patient currently have chest pain? No    R Recommendations: See Admitting Provider Note  Report given to:   Additional Notes:  n/a

## 2022-11-06 NOTE — H&P (Signed)
Cardiology Admission History and Physical   Patient ID: [name] [name] MRN: [id number]; DOB: 10-Sep-1945   Admission date: 11/05/2022  PCP:  [facility]   [facility] Providers Cardiologist:  None        Chief Complaint:  Dizziness  Patient Profile:   [name] [name] is a 77 y.o. male with pmh sx for HTN, OSA, HLD and GERD who is being seen 11/06/2022 for the evaluation of CHB.  History of Present Illness:   [name] [name] is a 77 y.o. male with pmh sx for HTN, OSA, HLD and GERD who is being seen 11/06/2022 for the evaluation of CHB. He has been having SOB since few days. Along with dizziness especially with exertion. No LOC or syncope. No CP. Never had such symptoms before. Hence called EMS and was taken to [facility] ED where found to have CHB with HR in 30s hence sent here. Currently denies any symptoms at rest. Feels dizziness with walking though. BP stable in the ED. Warm to touch- seems to be perfusing well. Cardiology called for admission. Of note, he has been taking atenolol for HTN for long time. Was just started on losartan for BP as well- took few doses only.    Past Medical History:  Diagnosis Date   Arthritis    Back pain    GERD (gastroesophageal reflux disease)    Hypercholesteremia    Hypertension    Neuropathy    in hands   Sleep apnea     Past Surgical History:  Procedure Laterality Date   CATARACT EXTRACTION, BILATERAL  2021   05/09/2019, 08/19/19   COLONOSCOPY     DENTAL SURGERY     had implants   HAND SURGERY  2008   left hand/ crushed bones   MIDDLE EAR SURGERY     put in metal pin in right ear/Dr Prescott Gum   SHOULDER ARTHROSCOPY Right 2009   2009,2020     Medications Prior to Admission: Prior to Admission medications   Medication Sig Start Date End Date Taking? Authorizing Provider  atenolol (TENORMIN) 25 MG tablet Take 25 mg by mouth 2 (two) times daily.    [provider]  Carboxymethylcellulose Sod PF 0.5 % SOLN  Apply 1 drop to eye as needed.    [provider]  cholecalciferol (VITAMIN D) 1000 UNITS tablet Take 2,000 Units by mouth daily.     [provider]  diclofenac sodium (VOLTAREN) 1 % GEL Apply topically as needed.    [provider]  fluticasone (FLONASE) 50 MCG/ACT nasal spray Place 2 sprays into both nostrils daily.    [provider]  furosemide (LASIX) 20 MG tablet Take 20 mg by mouth as needed.    [provider]  loratadine (CLARITIN) 10 MG tablet Take 10 mg by mouth daily.    [provider]  methocarbamol (ROBAXIN) 750 MG tablet Take 750 mg by mouth at bedtime.    [provider]  omeprazole (PRILOSEC) 20 MG capsule Take 20 mg by mouth 2 (two) times daily before a meal.     [provider]  rOPINIRole (REQUIP) 0.5 MG tablet Take 0.5 mg by mouth at bedtime.    [provider]  simvastatin (ZOCOR) 20 MG tablet Take 20 mg by mouth daily at 6 PM.    [provider]  tamsulosin (FLOMAX) 0.4 MG CAPS capsule Take 0.4 mg by mouth at bedtime.    [provider]  Allergies:   No Known Allergies  Social History:   Social History   Socioeconomic History   Marital status: Married    Spouse name: Not on file   Number of children: Not on file   Years of education: Not on file   Highest education level: Not on file  Occupational History   Not on file  Tobacco Use   Smoking status: Former    Packs/day: 0.50    Years: 50.00    Additional pack years: 0.00    Total pack years: 25.00    Types: Cigarettes, E-cigarettes   Smokeless tobacco: Former    Quit date: 05/25/2008   Tobacco comments:    quit 10 to 12 years   Substance and Sexual Activity   Alcohol use: No   Drug use: No   Sexual activity: Not on file  Other Topics Concern   Not on file  Social History Narrative   Not on file   Social Determinants of Health   Financial Resource Strain: Not on file  Food Insecurity: Not on  file  Transportation Needs: Not on file  Physical Activity: Not on file  Stress: Not on file  Social Connections: Not on file  Intimate Partner Violence: Not on file    Family History:   The patient's family history includes COPD in his sister; Cirrhosis in his brother and mother; Other in his brother and brother.    ROS:  Please see the history of present illness.  All other ROS reviewed and negative.     Physical Exam/Data:   Vitals:   11/05/22 2300 11/05/22 2315 11/05/22 2330 11/06/22 0000  BP: (!) 123/55 (!) 125/56 (!) 136/52   Pulse: (!) 53 (!) 30 66   Resp: (!) 23 15 (!) 22   Temp:      TempSrc:      SpO2: 91% 95% 95%   Weight:    124.1 kg   No intake or output data in the 24 hours ending 11/06/22 0024    11/06/2022   12:00 AM 06/30/2021   11:52 AM 04/15/2020   11:05 AM  Last 3 Weights  Weight (lbs) 273 lb 9.5 oz 261 lb 276 lb 6.4 oz  Weight (kg) 124.1 kg 118.389 kg 125.374 kg     Body mass index is 37.11 kg/m.  General:  Well nourished, well developed, in no acute distress HEENT: normal Neck: no JVD Vascular: No carotid bruits; Distal pulses 2+ bilaterally   Cardiac:  normal S1, S2; RRR; no murmur  Lungs:  clear to auscultation bilaterally, no wheezing, rhonchi or rales  Abd: soft, nontender, no hepatomegaly  Ext: no edema Musculoskeletal:  No deformities, BUE and BLE strength normal and equal Skin: warm and dry  Neuro:  CNs 2-12 intact, no focal abnormalities noted Psych:  Normal affect    EKG:  The ECG that was done  was personally reviewed and demonstrates CHB  Relevant CV Studies:  Echo in 2006: Normal LVEF  Laboratory Data:  Done at OSH ED  Radiology/Studies:  No results found.   Assessment and Plan:   # CHB # HTN # HLD # GERD # OSA  Patient coming with symptomatic bradycardia. Found to be in 3rd degree AV block HR in low 30s-symptoms with exertion Place pacing pads on- atropine at bedside Dopamine 2.5 if HR drop below  30s Telemetry Check lactic acid Echo in AM EP consult for PPM Hold losartan and atenolol Continue statin and PPI CPAP at night  For questions or updates, please contact [facility] Please consult [email or web address] for contact info under     Signed, Hermelinda Dellen, MD  11/06/2022 12:24 AM

## 2022-11-06 NOTE — Progress Notes (Signed)
  Echocardiogram 2D Echocardiogram has been performed.  Tenesia Escudero Wynn Banker 11/06/2022, 4:00 PM

## 2022-11-06 NOTE — Progress Notes (Signed)
Placed pt on cpap at this time per pt request. Pt on home settings with nasal mask and resting comfortably. RT informed pt to call for RN when he is ready to take cpap off

## 2022-11-06 NOTE — Progress Notes (Signed)
eLink Physician-Brief Progress Note Patient Name: [name] [name] DOB: 1946/05/09 MRN: [id number]   Date of Service  11/06/2022  HPI/Events of Note  Brief new admit  77 y.o. male with pmh sx for HTN, OSA, HLD and GERD who is being seen 11/06/2022 for the evaluation of CHB.   Cardiology on board.   Camera: Resting, HR 32, sats 90%.  MAP > 65  Discussed with RN. Labs reviewed. TSH normal. Mag pending.  eICU Interventions  Follow Cardiology advice, on dopamine, planning for PM for Monday.      Intervention Category Major Interventions: Arrhythmia - evaluation and management Evaluation Type: New Patient Evaluation  Ranee Gosselin 11/06/2022, 3:07 AM

## 2022-11-07 ENCOUNTER — Encounter (HOSPITAL_COMMUNITY): Payer: Self-pay | Admitting: Internal Medicine

## 2022-11-07 ENCOUNTER — Other Ambulatory Visit: Payer: Self-pay

## 2022-11-07 ENCOUNTER — Inpatient Hospital Stay (HOSPITAL_COMMUNITY)
Admission: EM | Disposition: A | Discharge status: Home / Self Care | Payer: Self-pay | Source: Home / Self Care | Attending: Internal Medicine

## 2022-11-07 HISTORY — PX: PACEMAKER IMPLANT: EP1218

## 2022-11-07 LAB — CBC
HCT: 43 % (ref 39.0–52.0)
Hemoglobin: 14.2 g/dL (ref 13.0–17.0)
MCH: 30.7 pg (ref 26.0–34.0)
MCHC: 33 g/dL (ref 30.0–36.0)
MCV: 92.9 fL (ref 80.0–100.0)
Platelets: 218 10*3/uL (ref 150–400)
RBC: 4.63 MIL/uL (ref 4.22–5.81)
RDW: 12.1 % (ref 11.5–15.5)
WBC: 11.7 10*3/uL — ABNORMAL HIGH (ref 4.0–10.5)
nRBC: 0 % (ref 0.0–0.2)

## 2022-11-07 LAB — BASIC METABOLIC PANEL
Anion gap: 11 (ref 5–15)
BUN: 30 mg/dL — ABNORMAL HIGH (ref 8–23)
CO2: 23 mmol/L (ref 22–32)
Calcium: 8.8 mg/dL — ABNORMAL LOW (ref 8.9–10.3)
Chloride: 104 mmol/L (ref 98–111)
Creatinine, Ser: 1.97 mg/dL — ABNORMAL HIGH (ref 0.61–1.24)
GFR, Estimated: 34 mL/min — ABNORMAL LOW (ref >60)
Glucose, Bld: 112 mg/dL — ABNORMAL HIGH (ref 70–99)
Potassium: 4.2 mmol/L (ref 3.5–5.1)
Sodium: 138 mmol/L (ref 135–145)

## 2022-11-07 LAB — SURGICAL PCR SCREEN
MRSA, PCR: NEGATIVE
Staphylococcus aureus: NEGATIVE

## 2022-11-07 SURGERY — PACEMAKER IMPLANT
Anesthesia: LOCAL

## 2022-11-07 MED ORDER — CEFAZOLIN SODIUM-DEXTROSE 1-4 GM/50ML-% IV SOLN
1.0000 g | Freq: Four times a day (QID) | INTRAVENOUS | Status: AC
  Administered 2022-11-07 – 2022-11-08 (×3): 1 g via INTRAVENOUS
  Filled 2022-11-07 (×4): qty 50

## 2022-11-07 MED ORDER — ONDANSETRON HCL 4 MG/2ML IJ SOLN
INTRAMUSCULAR | Status: AC
  Filled 2022-11-07: qty 2

## 2022-11-07 MED ORDER — CHLORHEXIDINE GLUCONATE 4 % EX LIQD
60.0000 mL | Freq: Once | CUTANEOUS | Status: AC
  Administered 2022-11-07: 4 via TOPICAL
  Filled 2022-11-07: qty 15

## 2022-11-07 MED ORDER — CEFAZOLIN SODIUM-DEXTROSE 2-4 GM/100ML-% IV SOLN
2.0000 g | INTRAVENOUS | Status: AC
  Administered 2022-11-07: 2 g via INTRAVENOUS
  Filled 2022-11-07 (×2): qty 100

## 2022-11-07 MED ORDER — SODIUM CHLORIDE 0.9 % IV SOLN: INTRAVENOUS | Status: DC

## 2022-11-07 MED ORDER — SODIUM CHLORIDE 0.9% FLUSH: 3.0000 mL | INTRAVENOUS | Status: DC | PRN

## 2022-11-07 MED ORDER — SODIUM CHLORIDE 0.9% FLUSH
3.0000 mL | Freq: Two times a day (BID) | INTRAVENOUS | Status: DC
  Administered 2022-11-07 (×2): 3 mL via INTRAVENOUS
  Administered 2022-11-08: 10 mL via INTRAVENOUS

## 2022-11-07 MED ORDER — MIDAZOLAM HCL 5 MG/5ML IJ SOLN
INTRAMUSCULAR | Status: DC | PRN
  Administered 2022-11-07: 1 mg via INTRAVENOUS

## 2022-11-07 MED ORDER — CHLORHEXIDINE GLUCONATE 4 % EX LIQD
60.0000 mL | Freq: Once | CUTANEOUS | Status: DC
Start: 2022-11-07 — End: 2022-11-07

## 2022-11-07 MED ORDER — SODIUM CHLORIDE 0.9 % IV SOLN
250.0000 mL | INTRAVENOUS | Status: DC
  Administered 2022-11-07: 250 mL via INTRAVENOUS

## 2022-11-07 MED ORDER — MIDAZOLAM HCL 2 MG/2ML IJ SOLN
INTRAMUSCULAR | Status: AC
  Filled 2022-11-07: qty 2

## 2022-11-07 MED ORDER — LIDOCAINE HCL (PF) 1 % IJ SOLN
INTRAMUSCULAR | Status: DC | PRN
  Administered 2022-11-07: 50 mL

## 2022-11-07 MED ORDER — LIDOCAINE HCL (PF) 1 % IJ SOLN
INTRAMUSCULAR | Status: AC
  Filled 2022-11-07: qty 60

## 2022-11-07 MED ORDER — ONDANSETRON HCL 4 MG/2ML IJ SOLN: 4.0000 mg | Freq: Four times a day (QID) | INTRAMUSCULAR | Status: DC | PRN

## 2022-11-07 MED ORDER — CEFAZOLIN SODIUM-DEXTROSE 2-4 GM/100ML-% IV SOLN
INTRAVENOUS | Status: AC
  Filled 2022-11-07: qty 100

## 2022-11-07 MED ORDER — ACETAMINOPHEN 325 MG PO TABS: 325.0000 mg | ORAL_TABLET | ORAL | Status: DC | PRN

## 2022-11-07 MED ORDER — HEPARIN (PORCINE) IN NACL 1000-0.9 UT/500ML-% IV SOLN
INTRAVENOUS | Status: DC | PRN
  Administered 2022-11-07: 500 mL

## 2022-11-07 MED ORDER — SODIUM CHLORIDE 0.9 % IV SOLN
80.0000 mg | INTRAVENOUS | Status: AC
  Administered 2022-11-07: 80 mg
  Filled 2022-11-07: qty 2

## 2022-11-07 MED ORDER — FENTANYL CITRATE (PF) 100 MCG/2ML IJ SOLN
INTRAMUSCULAR | Status: AC
  Filled 2022-11-07: qty 2

## 2022-11-07 MED ORDER — SODIUM CHLORIDE 0.9 % IV SOLN
INTRAVENOUS | Status: AC
  Filled 2022-11-07: qty 2

## 2022-11-07 MED ORDER — SODIUM CHLORIDE 0.9 % IV SOLN: INTRAVENOUS | Status: DC | PRN

## 2022-11-07 MED ORDER — FENTANYL CITRATE (PF) 100 MCG/2ML IJ SOLN
INTRAMUSCULAR | Status: DC | PRN
  Administered 2022-11-07: 12.5 ug via INTRAVENOUS

## 2022-11-07 SURGICAL SUPPLY — 16 items
CABLE SURGICAL S-101-97-12 (CABLE) ×1 IMPLANT
CATH CPS LOCATOR 3D LG (CATHETERS) IMPLANT
CATH CPS LOCATOR 3D MED (CATHETERS) IMPLANT
HELIX LOCKING TOOL (MISCELLANEOUS) ×1
KIT MICROPUNCTURE NIT STIFF (SHEATH) IMPLANT
LEAD ULTIPACE 52 LPA1231/52 (Lead) IMPLANT
LEAD ULTIPACE 65 LPA1231/65 (Lead) IMPLANT
MAT PREVALON FULL STRYKER (MISCELLANEOUS) IMPLANT
PACEMAKER ASSURITY DR-RF (Pacemaker) IMPLANT
PAD DEFIB RADIO PHYSIO CONN (PAD) ×1 IMPLANT
SHEATH 7FR PRELUDE SNAP 13 (SHEATH) IMPLANT
SHEATH 9FR PRELUDE SNAP 13 (SHEATH) IMPLANT
SLITTER AGILIS HISPRO (INSTRUMENTS) IMPLANT
TOOL HELIX LOCKING (MISCELLANEOUS) IMPLANT
TRAY PACEMAKER INSERTION (PACKS) ×1 IMPLANT
WIRE HI TORQ VERSACORE-J 145CM (WIRE) IMPLANT

## 2022-11-07 NOTE — Progress Notes (Addendum)
Rounding Note    Patient Name: [name] [name] Date of Encounter: 11/07/2022  [facility] Cardiologist: None   Subjective   Headache only  Reports that at rest here in bed, feels "OK", but any exertional gets him winded. No syncope but says at home maybe once/twice, things went black a seconds or so  Inpatient Medications    Scheduled Meds:  Chlorhexidine Gluconate Cloth  6 each Topical Daily   cholecalciferol  2,000 Units Oral Daily   fluticasone  2 spray Each Nare Daily   heparin  5,000 Units Subcutaneous Q8H   loratadine  10 mg Oral Daily   pantoprazole  40 mg Oral Daily   rOPINIRole  0.5 mg Oral QHS   simvastatin  20 mg Oral q1800   tamsulosin  0.4 mg Oral QHS   Continuous Infusions:  DOPamine 2.5 mcg/kg/min (11/06/22 2300)   PRN Meds: acetaminophen, ondansetron (ZOFRAN) IV, mouth rinse   Vital Signs    Vitals:   11/07/22 0500 11/07/22 0600 11/07/22 0645 11/07/22 0700  BP:   (!) 124/46   Pulse: (!) 28 (!) 30  (!) 31  Resp: Temp:      TempSrc:      SpO2: 94% 96%  94%  Weight:        Intake/Output Summary (Last 24 hours) at 11/07/2022 0719 Last data filed at 11/06/2022 2300 Gross per 24 hour  Intake 199.2 ml  Output 430 ml  Net -230.8 ml      11/06/2022   12:57 AM 11/06/2022   12:00 AM 06/30/2021   11:52 AM  Last 3 Weights  Weight (lbs) 192 lb 7.4 oz 273 lb 9.5 oz 261 lb  Weight (kg) 87.3 kg 124.1 kg 118.389 kg      Telemetry    CB 28 -30-35 - Personally Reviewed  ECG    No new EKGs - Personally Reviewed  Physical Exam   GEN: No acute distress.   Neck: No JVD Cardiac: RRR, bradycardic, no murmurs, rubs, or gallops.  Respiratory: CTA b/l GI: Soft, nontender, non-distended  MS: No edema; No deformity. Neuro:  Nonfocal  Psych: Normal affect   Labs    High Sensitivity Troponin:  No results for input(s): "TROPONINIHS" in the last 720 hours.   Chemistry Recent Labs  Lab 11/06/22 0132 11/07/22 0205  NA 141 138   K 4.1 4.2  CL 108 104  CO2 21* 23  GLUCOSE 101* 112*  BUN 21 30*  CREATININE 1.68* 1.97*  CALCIUM 8.9 8.8*  MG 2.0  --   GFRNONAA 42* 34*  ANIONGAP 12 11    Lipids No results for input(s): "CHOL", "TRIG", "HDL", "LABVLDL", "LDLCALC", "CHOLHDL" in the last 168 hours.  Hematology Recent Labs  Lab 11/06/22 0132 11/07/22 0205  WBC 11.9* 11.7*  RBC 5.00 4.63  HGB 15.2 14.2  HCT 47.6 43.0  MCV 95.2 92.9  MCH 30.4 30.7  MCHC 31.9 33.0  RDW 12.4 12.1  PLT 246 218   Thyroid  Recent Labs  Lab 11/06/22 0132  TSH 4.446    BNPNo results for input(s): "BNP", "PROBNP" in the last 168 hours.  DDimer No results for input(s): "DDIMER" in the last 168 hours.   Radiology      Cardiac Studies   11/06/22: TTE . Very limited study due to very poor echo windows.   2. Patient is in complete heart block during study.   3. Left ventricular ejection fraction, by estimation, is  55 to 60%. The  left ventricle has normal function. The left ventricle has no regional  wall motion abnormalities. Left ventricular diastolic parameters are  consistent with Grade I diastolic  dysfunction (impaired relaxation).   4. Based on very limited views, the RV systolic function appears normal.  The RV appears at least mildly enlarged based on very limited views.   5. The mitral valve is grossly normal.   6. The aortic valve was not well visualized.   7. The inferior vena cava is normal in size with greater than 50%  respiratory variability, suggesting right atrial pressure of 3 mmHg.   Patient Profile     77 y.o. male w/PMHx of HTN, OSA, HLD, GERD admitted via [facility] w/CHB  Pt reports not feeling well for weeks, has been skipping his atenolol here and there, especially in the last week, skipped a couple days here/there, did take it Sat AM sinec stopping it didn't seem to help  He had not had syncope  Assessment & Plan    CHB Atenolol out patient Last dose 11/05/22 On dopamine @ 2.5,  remains in CHB this AM Preserved LVEF BP OK High O2 needs > 5 > 7L this AM RBBB escape  (last EKG 2016 narrow QRS) Pacer pads on  Has been 48 hours off his BB will need PPM Discussed rational for PPM, implant procedure, potential risks, he is agreeable to proceed.  NPO Orders in   HTN Off home meds right now   3. AKI Likely 2/2 bradycardia Labs in AM   Dr. Ladona Ridgel will see him later this morning   For questions or updates, please contact [facility] Please consult [email or web address] for contact info under        Signed, Sheilah Pigeon, PA-C  11/07/2022, 7:19 AM     EP Attending  Patient seen and examined. Agree with the findings as noted above. The patient presents with symptomatic, CHB, and has now been off of his beta blocker for over 48 hours and his CHB persists. He has not had syncope and has preserved LV function.   On exam he is a pleasant well appearing man, NAD. LUngs are clear and CV reveals a regular bradycardia. Ext are warm and neur is non-focal.  Tele demonestrates CHB with NSR  A/P CHB -  I have reviewed the indications/risks/benefits/goals/expectations of PPM insertion and he wishes to proceed.

## 2022-11-08 ENCOUNTER — Other Ambulatory Visit: Payer: Self-pay | Admitting: Physician Assistant

## 2022-11-08 ENCOUNTER — Inpatient Hospital Stay (HOSPITAL_COMMUNITY): Payer: No Typology Code available for payment source

## 2022-11-08 DIAGNOSIS — N179 Acute kidney failure, unspecified: Secondary | ICD-10-CM

## 2022-11-08 LAB — BASIC METABOLIC PANEL
Anion gap: 8 (ref 5–15)
BUN: 23 mg/dL (ref 8–23)
CO2: 24 mmol/L (ref 22–32)
Calcium: 8.4 mg/dL — ABNORMAL LOW (ref 8.9–10.3)
Chloride: 106 mmol/L (ref 98–111)
Creatinine, Ser: 1.64 mg/dL — ABNORMAL HIGH (ref 0.61–1.24)
GFR, Estimated: 43 mL/min — ABNORMAL LOW (ref >60)
Glucose, Bld: 94 mg/dL (ref 70–99)
Potassium: 4.3 mmol/L (ref 3.5–5.1)
Sodium: 138 mmol/L (ref 135–145)

## 2022-11-08 MED ORDER — LOSARTAN POTASSIUM 50 MG PO TABS
50.0000 mg | ORAL_TABLET | Freq: Every day | ORAL | Status: DC
  Administered 2022-11-08: 50 mg via ORAL
  Filled 2022-11-08: qty 1

## 2022-11-08 MED ORDER — LOSARTAN POTASSIUM 50 MG PO TABS: 50.0000 mg | ORAL_TABLET | Freq: Every day | ORAL | 5 refills | Status: AC

## 2022-11-08 MED FILL — Midazolam HCl Inj 2 MG/2ML (Base Equivalent): INTRAMUSCULAR | Qty: 1 | Status: AC

## 2022-11-08 NOTE — Plan of Care (Signed)
  Problem: Education: Goal: Knowledge of General Education information will improve Description: Including pain rating scale, medication(s)/side effects and non-pharmacologic comfort measures Outcome: Adequate for Discharge   Problem: Health Behavior/Discharge Planning: Goal: Ability to manage health-related needs will improve Outcome: Adequate for Discharge   Problem: Clinical Measurements: Goal: Ability to maintain clinical measurements within normal limits will improve Outcome: Adequate for Discharge Goal: Will remain free from infection Outcome: Adequate for Discharge Goal: Diagnostic test results will improve Outcome: Adequate for Discharge Goal: Respiratory complications will improve Outcome: Adequate for Discharge Goal: Cardiovascular complication will be avoided Outcome: Adequate for Discharge   Problem: Activity: Goal: Risk for activity intolerance will decrease Outcome: Adequate for Discharge   Problem: Nutrition: Goal: Adequate nutrition will be maintained Outcome: Adequate for Discharge   Problem: Coping: Goal: Level of anxiety will decrease Outcome: Adequate for Discharge   Problem: Elimination: Goal: Will not experience complications related to bowel motility Outcome: Adequate for Discharge Goal: Will not experience complications related to urinary retention Outcome: Adequate for Discharge   Problem: Pain Managment: Goal: General experience of comfort will improve Outcome: Adequate for Discharge   Problem: Safety: Goal: Ability to remain free from injury will improve Outcome: Adequate for Discharge   Problem: Skin Integrity: Goal: Risk for impaired skin integrity will decrease Outcome: Adequate for Discharge   Problem: Education: Goal: Knowledge of cardiac device and self-care will improve Outcome: Adequate for Discharge Goal: Ability to safely manage health related needs after discharge will improve Outcome: Adequate for Discharge Goal:  Individualized Educational Video(s) Outcome: Not Applicable   Problem: Cardiac: Goal: Ability to achieve and maintain adequate cardiopulmonary perfusion will improve Outcome: Adequate for Discharge

## 2022-11-08 NOTE — Discharge Summary (Addendum)
ELECTROPHYSIOLOGY PROCEDURE DISCHARGE SUMMARY    Patient ID: [name] [name],  MRN: [id number], DOB/AGE: Jan 24, 1946 77 y.o.  Admit date: 11/05/2022 Discharge date: 11/08/2022  Primary Care Physician: [facility]  Primary Cardiologist: none Electrophysiologist: new, Dr. Ladona Ridgel  Primary Discharge Diagnosis:  CHB status post pacemaker implantation this admission  Secondary Discharge Diagnosis:  HTN OSA HLD  No Known Allergies   Procedures This Admission:  1.  Implantation of a Abbott dual chamber PPM on 11/07/22 by Dr Ladona Ridgel.   There were no immediate post procedure complications. CXR on 11/08/22 demonstrated no pneumothorax status post device implantation.   Brief HPI: [name] [name] is a 77 y.o. male in the last few week with worsening exertional capacity, some dizziness, and the day of admission, acute worsening of symptoms, perhaps brief near syncope, no syncope, no CP. He was initially evaluated at [facility] ER found in CHB and transported to [facility] for further care.   Hospital Course:  The patient was admitted his home atenolol held, eventually started on dopamine for HR support, BP stable.  TTE noted preserved LVEF, no significant VHD.  Despite washout of his atenolol, CHB persisted and underwent implantation of a PPM with details as outlined in the procedure report.  AKI noted felty 2/2 to his bradycardia.  Creat declining day of discharge.  Telemetry is SR/V paced.  Left chest was without hematoma or ecchymosis.  The device was interrogated and found to be functioning normally.  CXR was obtained and demonstrated no pneumothorax status post device implantation.  Wound care, arm mobility, and restrictions were reviewed with the patient.  The patient feels very well, denies any CP/SOB, anticipate with SR/adequate HR and mobilization, noted R pleural effusion (on Dr. Lubertha Basque review of xray, and on prior) will resolve, he has ambulated a few times around the  unit, denies site discomfort.  He was examined by Dr.  Ladona Ridgel and considered stable for discharge to home.   Will stop atenolol Continue his home losartan at  daily (not 25) BMET at his wound check visit   Physical Exam: Vitals:   11/08/22 0400 11/08/22 0500 11/08/22 0600 11/08/22 0812  BP: (!) 121/58 126/64 (!) 110/58 (!) 164/67  Pulse: 72 65 67 (!) 105  Resp: (!) 28 (!) 21 (!) 26 (!) 23  Temp:   99.5 F (37.5 C)   TempSrc:   Oral   SpO2: (!) 89% 94% 94% 92%  Weight:        GEN- The patient is well appearing, alert and oriented x 3 today.   HEENT: normocephalic, atraumatic; sclera clear, conjunctiva pink; hearing intact; oropharynx clear; neck supple, no JVP Lungs- diminished L base, otherwise clear b/l, normal work of breathing.  No wheezes, rales, rhonchi Heart- RRR, no murmurs, rubs or gallops, PMI not laterally displaced GI- soft, non-tender, non-distended Extremities- no clubbing, cyanosis, or edema MS- no significant deformity or atrophy Skin- warm and dry, no rash or lesion, left chest without hematoma/ecchymosis Psych- euthymic mood, full affect Neuro- no gross deficits    Labs:   Lab Results  Component Value Date   WBC 11.7 (H) 11/07/2022   HGB 14.2 11/07/2022   HCT 43.0 11/07/2022   MCV 92.9 11/07/2022   PLT 218 11/07/2022    Recent Labs  Lab 11/08/22 0026  NA 138  K 4.3  CL 106  CO2 24  BUN 23  CREATININE 1.64*  CALCIUM 8.4*  GLUCOSE 94    Discharge Medications:  Allergies as of 11/08/2022   No Known Allergies      Medication List     STOP taking these medications    atenolol 25 MG tablet Commonly known as: TENORMIN       TAKE these medications    Carboxymethylcellulose Sod PF 0.5 % Soln Place 1 drop into both eyes daily as needed (For allergies).   cholecalciferol 1000 units tablet Commonly known as: VITAMIN D Take 2,000 Units by mouth daily.   diclofenac sodium 1 % Gel Commonly known as: VOLTAREN Apply 2 g topically  daily as needed (For knee, shoulder pain).   fluticasone 50 MCG/ACT nasal spray Commonly known as: FLONASE Place 2 sprays into both nostrils daily as needed for allergies.   losartan 50 MG tablet Commonly known as: COZAAR Take 1 tablet (50 mg total) by mouth daily. Start taking on: November 09, 2022 What changed: how much to take   methocarbamol 750 MG tablet Commonly known as: ROBAXIN Take 750 mg by mouth at bedtime.   omeprazole 20 MG capsule Commonly known as: PRILOSEC Take 20 mg by mouth daily.   rOPINIRole 0.5 MG tablet Commonly known as: REQUIP Take 0.5 mg by mouth at bedtime.   simvastatin 20 MG tablet Commonly known as: ZOCOR Take 20 mg by mouth daily at 6 PM.   tamsulosin 0.4 MG Caps capsule Commonly known as: FLOMAX Take 0.4 mg by mouth at bedtime.        Disposition: Home Discharge Instructions     Diet - low sodium heart healthy   Complete by: As directed    Increase activity slowly   Complete by: As directed          Duration of Discharge Encounter: Greater than 30 minutes including physician time.  Norma Fredrickson, PA-C 11/08/2022 10:21 AM  EP Attending  Patient seen and examined. Agree with above. The patient is doing well after DDD PM insertion. His CXR looks good and his interrogation under my direct supervision demonstrates normal DDD PM function. He will be discharged home with usual followup.  Sharlot Gowda Dayden Viverette,MD

## 2022-11-08 NOTE — Progress Notes (Signed)
Patient declined the use of CPAP for the night  

## 2022-11-08 NOTE — TOC Initial Note (Addendum)
Transition of Care [facility]) - Initial/Assessment Note    Patient Details  Name: [name] [name] MRN: [id number] Date of Birth: March 31, 1946  Transition of Care [facility]) CM/SW Contact:    [name] Cousin, RN Phone Number: [phone] 11/08/2022, 11:19 AM  Clinical Narrative:                 CM spoke to patient and independent PTA. States he drives to appt. Has scale at home for daily weights. Educated the importance of daily weights. [facility] ID # [id number], Dion.Samples # [id number].   Expected Discharge Plan: Home/Self Care Barriers to Discharge: No Barriers Identified   Patient Goals and CMS Choice Patient states their goals for this hospitalization and ongoing recovery are:: wants to remain independent          Expected Discharge Plan and Services   Discharge Planning Services: CM Consult   Living arrangements for the past 2 months: Single Family Home Expected Discharge Date: 11/08/22                                    Prior Living Arrangements/Services Living arrangements for the past 2 months: Single Family Home Lives with:: Spouse Patient language and need for interpreter reviewed:: Yes        Need for Family Participation in Patient Care: No (Comment) Care giver support system in place?: Yes (comment)   Criminal Activity/Legal Involvement Pertinent to Current Situation/Hospitalization: No - Comment as needed  Activities of Daily Living Home Assistive Devices/Equipment: Blood pressure cuff, Brace (specify type), CPAP, Dentures (specify type), Eyeglasses, Cane (specify quad or straight), Walker (specify type) (Occasional back brace, Full upper dentures, partial lower dentures,) ADL Screening (condition at time of admission) Patient's cognitive ability adequate to safely complete daily activities?: Yes Is the patient deaf or have difficulty hearing?: No Does the patient have difficulty seeing, even when wearing  glasses/contacts?: No Does the patient have difficulty concentrating, remembering, or making decisions?: No Patient able to express need for assistance with ADLs?: Yes Does the patient have difficulty dressing or bathing?: No Independently performs ADLs?: Yes (appropriate for developmental age) Does the patient have difficulty walking or climbing stairs?: No Weakness of Legs: None Weakness of Arms/Hands: None  Permission Sought/Granted Permission sought to share information with : Case Manager, Family Supports, PCP Permission granted to share information with : Yes, Verbal Permission Granted  Share Information with NAME: [name] [name]  Permission granted to share info w AGENCY: wife     Permission granted to share info w Contact Information: [phone]  Emotional Assessment Appearance:: Appears stated age Attitude/Demeanor/Rapport: Engaged Affect (typically observed): Accepting Orientation: : Oriented to Self, Oriented to Place, Oriented to  Time, Oriented to Situation   Psych Involvement: No (comment)  Admission diagnosis:  Complete heart block [I44.2] Bradycardia [R00.1] Patient Active Problem List   Diagnosis Date Noted   Complete heart block 11/06/2022   Obstructive sleep apnea 06/30/2021   Hyperlipidemia 12/14/2017   Essential hypertension 12/14/2017   Gastroesophageal reflux disease 12/14/2017   Chronic low back pain 12/14/2017   RLS (restless legs syndrome) 12/14/2017   PCP:  [facility] Pharm[facility]:   [id number] - [place], [place] - [address] AT [facility] [address] [place] [address] Phone: [phone] Fax: [phone]     Social Determinants of Health (SDOH) Social History: SDOH  Screenings   Food Insecurity: No Food Insecurity (11/07/2022)  Housing: Low Risk  (11/07/2022)  Transportation Needs: No Transportation Needs (11/07/2022)  Utilities: Not At Risk (11/07/2022)  Depression (PHQ2-9): Low Risk   (06/30/2021)  Tobacco Use: Medium Risk (11/07/2022)   SDOH Interventions:     Readmission Risk Interventions     No data to display

## 2022-11-22 ENCOUNTER — Ambulatory Visit: Payer: Medicare HMO | Admitting: Student

## 2022-12-06 ENCOUNTER — Telehealth: Payer: Self-pay

## 2022-12-06 NOTE — Telephone Encounter (Signed)
Left detailed message requesting call back to send remote transmission.

## 2022-12-06 NOTE — Telephone Encounter (Signed)
Need remote transmission.

## 2022-12-07 ENCOUNTER — Ambulatory Visit: Payer: No Typology Code available for payment source | Attending: Student

## 2022-12-07 DIAGNOSIS — I442 Atrioventricular block, complete: Secondary | ICD-10-CM | POA: Diagnosis not present

## 2022-12-07 LAB — CUP PACEART INCLINIC DEVICE CHECK
Battery Remaining Longevity: 56 mo
Battery Voltage: 3.01 V
Brady Statistic RA Percent Paced: 0.65 %
Brady Statistic RV Percent Paced: 99.25 %
Date Time Interrogation Session: 20240515114328
Implantable Lead Connection Status: 753985
Implantable Lead Connection Status: 753985
Implantable Lead Implant Date: 20240415
Implantable Lead Implant Date: 20240415
Implantable Lead Location: 753859
Implantable Lead Location: 753860
Implantable Pulse Generator Implant Date: 20240415
Lead Channel Impedance Value: 312.5 Ohm
Lead Channel Impedance Value: 475 Ohm
Lead Channel Pacing Threshold Amplitude: 0.5 V
Lead Channel Pacing Threshold Amplitude: 0.5 V
Lead Channel Pacing Threshold Amplitude: 0.5 V
Lead Channel Pacing Threshold Amplitude: 0.5 V
Lead Channel Pacing Threshold Pulse Width: 0.5 ms
Lead Channel Pacing Threshold Pulse Width: 0.5 ms
Lead Channel Pacing Threshold Pulse Width: 0.5 ms
Lead Channel Pacing Threshold Pulse Width: 0.5 ms
Lead Channel Sensing Intrinsic Amplitude: 5 mV
Lead Channel Sensing Intrinsic Amplitude: 7.2 mV
Lead Channel Setting Pacing Amplitude: 3.5 V
Lead Channel Setting Pacing Amplitude: 3.5 V
Lead Channel Setting Pacing Pulse Width: 0.5 ms
Lead Channel Setting Sensing Sensitivity: 0.5 mV
Pulse Gen Model: 2272
Pulse Gen Serial Number: 8169022

## 2022-12-07 NOTE — Progress Notes (Signed)
Wound check appointment. Steri-strips removed. Wound without redness or edema. Incision edges approximated, wound well healed. Normal device function. Thresholds, sensing, and impedances consistent with implant measurements. Device programmed at 3.5V/auto capture programmed on for extra safety margin until 3 month visit. Histogram distribution appropriate for patient and level of activity. Brief AT episodes noted. No mode switches or high ventricular rates noted. Patient educated about wound care, arm mobility, lifting restrictions. ROV in 3 months with implanting physician.  V sensitivity changed to auto. Patient to set up remote monitoring before end of week. JS RN will follow up. Pt number [phone]

## 2022-12-07 NOTE — Patient Instructions (Signed)

## 2023-02-07 ENCOUNTER — Ambulatory Visit (INDEPENDENT_AMBULATORY_CARE_PROVIDER_SITE_OTHER): Payer: Medicare HMO

## 2023-02-07 DIAGNOSIS — I442 Atrioventricular block, complete: Secondary | ICD-10-CM

## 2023-02-08 LAB — CUP PACEART REMOTE DEVICE CHECK
Battery Remaining Longevity: 53 mo
Battery Remaining Percentage: 95.5 %
Battery Voltage: 2.98 V
Brady Statistic AP VP Percent: 2.1 %
Brady Statistic AP VS Percent: 1 %
Brady Statistic AS VP Percent: 97 %
Brady Statistic AS VS Percent: 1 %
Brady Statistic RA Percent Paced: 1.2 %
Brady Statistic RV Percent Paced: 99 %
Date Time Interrogation Session: 20240716020016
Implantable Lead Connection Status: 753985
Implantable Lead Connection Status: 753985
Implantable Lead Implant Date: 20240415
Implantable Lead Implant Date: 20240415
Implantable Lead Location: 753859
Implantable Lead Location: 753860
Implantable Pulse Generator Implant Date: 20240415
Lead Channel Impedance Value: 310 Ohm
Lead Channel Impedance Value: 460 Ohm
Lead Channel Pacing Threshold Amplitude: 0.5 V
Lead Channel Pacing Threshold Amplitude: 0.5 V
Lead Channel Pacing Threshold Pulse Width: 0.5 ms
Lead Channel Pacing Threshold Pulse Width: 0.5 ms
Lead Channel Sensing Intrinsic Amplitude: 0.5 mV
Lead Channel Sensing Intrinsic Amplitude: 5 mV
Lead Channel Setting Pacing Amplitude: 3.5 V
Lead Channel Setting Pacing Amplitude: 3.5 V
Lead Channel Setting Pacing Pulse Width: 0.5 ms
Lead Channel Setting Sensing Sensitivity: 0.5 mV
Pulse Gen Model: 2272
Pulse Gen Serial Number: 8169022

## 2023-02-09 ENCOUNTER — Ambulatory Visit: Payer: Medicare HMO | Attending: Internal Medicine | Admitting: Internal Medicine

## 2023-02-09 ENCOUNTER — Encounter: Payer: Self-pay | Admitting: Internal Medicine

## 2023-02-09 VITALS — BP 128/72 | HR 80 | Ht 72.0 in | Wt 272.0 lb

## 2023-02-09 DIAGNOSIS — I442 Atrioventricular block, complete: Secondary | ICD-10-CM

## 2023-02-09 DIAGNOSIS — Z950 Presence of cardiac pacemaker: Secondary | ICD-10-CM | POA: Diagnosis not present

## 2023-02-09 LAB — CUP PACEART INCLINIC DEVICE CHECK
Battery Remaining Longevity: 91 mo
Battery Voltage: 2.98 V
Brady Statistic RA Percent Paced: 1.1 %
Brady Statistic RV Percent Paced: 99 %
Date Time Interrogation Session: 20240718163349
Implantable Lead Connection Status: 753985
Implantable Lead Connection Status: 753985
Implantable Lead Implant Date: 20240415
Implantable Lead Implant Date: 20240415
Implantable Lead Location: 753859
Implantable Lead Location: 753860
Implantable Pulse Generator Implant Date: 20240415
Lead Channel Impedance Value: 337.5 Ohm
Lead Channel Impedance Value: 550 Ohm
Lead Channel Pacing Threshold Amplitude: 0.5 V
Lead Channel Pacing Threshold Amplitude: 0.5 V
Lead Channel Pacing Threshold Amplitude: 0.75 V
Lead Channel Pacing Threshold Amplitude: 0.75 V
Lead Channel Pacing Threshold Pulse Width: 0.5 ms
Lead Channel Pacing Threshold Pulse Width: 0.5 ms
Lead Channel Pacing Threshold Pulse Width: 0.5 ms
Lead Channel Pacing Threshold Pulse Width: 0.5 ms
Lead Channel Sensing Intrinsic Amplitude: 5 mV
Lead Channel Sensing Intrinsic Amplitude: 7 mV
Lead Channel Setting Pacing Amplitude: 2 V
Lead Channel Setting Pacing Amplitude: 2.5 V
Lead Channel Setting Pacing Pulse Width: 0.5 ms
Lead Channel Setting Sensing Sensitivity: 0.5 mV
Pulse Gen Model: 2272
Pulse Gen Serial Number: 8169022

## 2023-02-09 NOTE — Progress Notes (Signed)
HPI [name] [name] returns today for followup. He is a pleasant 77 yo man with CHB, and obesity, s/p PPM insertion. The patient has done well in the interim. He denies chest pain or sob. No edema. No syncope.  No Known Allergies   Current Outpatient Medications  Medication Sig Dispense Refill   Carboxymethylcellulose Sod PF 0.5 % SOLN Place 1 drop into both eyes daily as needed (For allergies).     cholecalciferol (VITAMIN D) 1000 UNITS tablet Take 2,000 Units by mouth daily.      diclofenac sodium (VOLTAREN) 1 % GEL Apply 2 g topically daily as needed (For knee, shoulder pain).     fluticasone (FLONASE) 50 MCG/ACT nasal spray Place 2 sprays into both nostrils daily as needed for allergies.     losartan (COZAAR) 50 MG tablet Take 1 tablet (50 mg total) by mouth daily. 30 tablet 5   methocarbamol (ROBAXIN) 750 MG tablet Take 750 mg by mouth at bedtime.     omeprazole (PRILOSEC) 20 MG capsule Take 20 mg by mouth daily.     rOPINIRole (REQUIP) 0.5 MG tablet Take 0.5 mg by mouth at bedtime.     simvastatin (ZOCOR) 20 MG tablet Take 20 mg by mouth daily at 6 PM.     tamsulosin (FLOMAX) 0.4 MG CAPS capsule Take 0.4 mg by mouth at bedtime.     No current facility-administered medications for this visit.     Past Medical History:  Diagnosis Date   Arthritis    Back pain    GERD (gastroesophageal reflux disease)    Hypercholesteremia    Hypertension    Neuropathy    in hands   Sleep apnea     ROS:   All systems reviewed and negative except as noted in the HPI.   Past Surgical History:  Procedure Laterality Date   CATARACT EXTRACTION, BILATERAL  2021   05/09/2019, 08/19/19   COLONOSCOPY     DENTAL SURGERY     had implants   HAND SURGERY  2008   left hand/ crushed bones   MIDDLE EAR SURGERY     put in metal pin in right ear/Dr Prescott Gum   PACEMAKER IMPLANT N/A 11/07/2022   Procedure: PACEMAKER IMPLANT;  Surgeon: Marinus Maw, MD;  Location[facility] INVASIVE CV LAB;   Service: Cardiovascular;  Laterality: N/A;   SHOULDER ARTHROSCOPY Right 2009   2009,2020     Family History  Problem Relation Age of Onset   Cirrhosis Mother        no alcohol   COPD Sister    Cirrhosis Brother    Other Brother        uncertain   Other Brother        stillborn     Social History   Socioeconomic History   Marital status: Married    Spouse name: Not on file   Number of children: Not on file   Years of education: Not on file   Highest education level: Not on file  Occupational History   Not on file  Tobacco Use   Smoking status: Former    Current packs/day: 0.50    Average packs/day: 0.5 packs/day for 50.0 years (25.0 ttl pk-yrs)    Types: Cigarettes, E-cigarettes   Smokeless tobacco: Former    Quit date: 05/25/2008   Tobacco comments:    quit 10 to 12 years   Substance and Sexual Activity   Alcohol use: No   Drug use:  No   Sexual activity: Not on file  Other Topics Concern   Not on file  Social History Narrative   Not on file   Social Determinants of Health   Financial Resource Strain: Not on file  Food Insecurity: No Food Insecurity (11/07/2022)   Hunger Vital Sign    Worried About Running Out of Food in the Last Year: Never true    Ran Out of Food in the Last Year: Never true  Transportation Needs: No Transportation Needs (11/07/2022)   PRAPARE - Administrator, Civil Service (Medical): No    Lack of Transportation (Non-Medical): No  Physical Activity: Not on file  Stress: Not on file  Social Connections: Not on file  Intimate Partner Violence: Not At Risk (11/07/2022)   Humiliation, Afraid, Rape, and Kick questionnaire    Fear of Current or Ex-Partner: No    Emotionally Abused: No    Physically Abused: No    Sexually Abused: No     BP 128/72   Pulse 80   Ht 6' (1.829 m)   Wt 272 lb (123.4 kg)   SpO2 94%   BMI 36.89 kg/m   Physical Exam:  Well appearing NAD HEENT: Unremarkable Neck:  No JVD, no  thyromegally Lymphatics:  No adenopathy Back:  No CVA tenderness Lungs:  Clear with no wheezes HEART:  Regular rate rhythm, no murmurs, no rubs, no clicks Abd:  soft, positive bowel sounds, no organomegally, no rebound, no guarding Ext:  2 plus pulses, no edema, no cyanosis, no clubbing Skin:  No rashes no nodules Neuro:  CN II through XII intact, motor grossly intact  EKG Nsr with ventricular pacing  DEVICE  Normal device function.  See PaceArt for details.   Assess/Plan: CHB - he is doing well s/p PPM insertion. PPM - his [employer] Jude DDD PM is working normally. We will recheck in several months.  HTN - his bp is fairly well controlled. No change.  [name] Gowda Shelly Spenser,MD

## 2023-02-09 NOTE — Patient Instructions (Signed)
Medication Instructions:  Your physician recommends that you continue on your current medications as directed. Please refer to the Current Medication list given to you today.  *If you need a refill on your cardiac medications before your next appointment, please call your pharmacy*   Lab Work: None ordered.  If you have labs (blood work) drawn today and your tests are completely normal, you will receive your results only by: MyChart Message (if you have MyChart) OR A paper copy in the mail If you have any lab test that is abnormal or we need to change your treatment, we will call you to review the results.   Testing/Procedures: None ordered.    Follow-Up: At [facility], you and your health needs are our priority.  As part of our continuing mission to provide you with exceptional heart care, we have created designated Provider Care Teams.  These Care Teams include your primary Cardiologist (physician) and Advanced Practice Providers (APPs -  Physician Assistants and Nurse Practitioners) who all work together to provide you with the care you need, when you need it.  We recommend signing up for the patient portal called "MyChart".  Sign up information is provided on this After Visit Summary.  MyChart is used to connect with patients for Virtual Visits (Telemedicine).  Patients are able to view lab/test results, encounter notes, upcoming appointments, etc.  Non-urgent messages can be sent to your provider as well.   To learn more about what you can do with MyChart, go to [email or web address].    Your next appointment:   12 months with Dr Ladona Ridgel

## 2023-02-23 NOTE — Progress Notes (Signed)
Remote pacemaker transmission.   

## 2023-03-29 ENCOUNTER — Other Ambulatory Visit (HOSPITAL_COMMUNITY): Payer: Self-pay | Admitting: Internal Medicine

## 2023-03-29 DIAGNOSIS — R413 Other amnesia: Secondary | ICD-10-CM

## 2023-04-04 ENCOUNTER — Ambulatory Visit (HOSPITAL_COMMUNITY)
Admission: RE | Admit: 2023-04-04 | Discharge: 2023-04-04 | Disposition: A | Discharge status: Home / Self Care | Payer: No Typology Code available for payment source | Source: Ambulatory Visit | Attending: Internal Medicine | Admitting: Internal Medicine

## 2023-04-04 ENCOUNTER — Encounter (HOSPITAL_COMMUNITY): Payer: Self-pay

## 2023-04-04 ENCOUNTER — Other Ambulatory Visit (HOSPITAL_COMMUNITY): Payer: Self-pay | Admitting: Internal Medicine

## 2023-04-04 DIAGNOSIS — R413 Other amnesia: Secondary | ICD-10-CM | POA: Insufficient documentation

## 2023-04-04 DIAGNOSIS — Z0189 Encounter for other specified special examinations: Secondary | ICD-10-CM | POA: Diagnosis present
